# Patient Record
Sex: Male | Born: 1965 | Race: Black or African American | Hispanic: No | Marital: Married | Smoking: Never smoker
Health system: Southern US, Community
[De-identification: ages and names within clinical notes are randomized; demographics above are authoritative.]

## PROBLEM LIST (undated history)

## (undated) DIAGNOSIS — I10 Essential (primary) hypertension: Secondary | ICD-10-CM

## (undated) DIAGNOSIS — M1990 Unspecified osteoarthritis, unspecified site: Secondary | ICD-10-CM

## (undated) DIAGNOSIS — C61 Malignant neoplasm of prostate: Secondary | ICD-10-CM

## (undated) DIAGNOSIS — K219 Gastro-esophageal reflux disease without esophagitis: Secondary | ICD-10-CM

## (undated) DIAGNOSIS — G4730 Sleep apnea, unspecified: Secondary | ICD-10-CM

## (undated) DIAGNOSIS — E119 Type 2 diabetes mellitus without complications: Secondary | ICD-10-CM

## (undated) DIAGNOSIS — I2510 Atherosclerotic heart disease of native coronary artery without angina pectoris: Secondary | ICD-10-CM

## (undated) DIAGNOSIS — F419 Anxiety disorder, unspecified: Secondary | ICD-10-CM

## (undated) DIAGNOSIS — M109 Gout, unspecified: Secondary | ICD-10-CM

## (undated) HISTORY — PX: PROSTATE BIOPSY: SHX241

## (undated) HISTORY — PX: WISDOM TOOTH EXTRACTION: SHX21

---

## 2018-03-17 ENCOUNTER — Emergency Department (HOSPITAL_COMMUNITY)
Admission: EM | Admit: 2018-03-17 | Discharge: 2018-03-17 | Disposition: A | Discharge status: Home / Self Care | Payer: Self-pay | Attending: Emergency Medicine | Admitting: Emergency Medicine

## 2018-03-17 ENCOUNTER — Other Ambulatory Visit: Payer: Self-pay

## 2018-03-17 ENCOUNTER — Encounter (HOSPITAL_COMMUNITY): Payer: Self-pay | Admitting: Emergency Medicine

## 2018-03-17 DIAGNOSIS — R5383 Other fatigue: Secondary | ICD-10-CM | POA: Insufficient documentation

## 2018-03-17 DIAGNOSIS — I10 Essential (primary) hypertension: Secondary | ICD-10-CM | POA: Insufficient documentation

## 2018-03-17 HISTORY — DX: Essential (primary) hypertension: I10

## 2018-03-17 LAB — CBC WITH DIFFERENTIAL/PLATELET
Basophils Absolute: 0 10*3/uL (ref 0.0–0.1)
Basophils Relative: 0 %
Eosinophils Absolute: 0.3 10*3/uL (ref 0.0–0.5)
Eosinophils Relative: 6 %
HCT: 48 % (ref 39.0–52.0)
Hemoglobin: 14.4 g/dL (ref 13.0–17.0)
Lymphocytes Relative: 44 %
Lymphs Abs: 2.3 10*3/uL (ref 0.7–4.0)
MCH: 21.6 pg — ABNORMAL LOW (ref 26.0–34.0)
MCHC: 30 g/dL (ref 30.0–36.0)
MCV: 72.1 fL — ABNORMAL LOW (ref 80.0–100.0)
Monocytes Absolute: 0.3 10*3/uL (ref 0.1–1.0)
Monocytes Relative: 6 %
Neutro Abs: 2.3 10*3/uL (ref 1.7–7.7)
Neutrophils Relative %: 44 %
Platelets: 219 10*3/uL (ref 150–400)
RBC: 6.66 MIL/uL — ABNORMAL HIGH (ref 4.22–5.81)
RDW: 16 % — ABNORMAL HIGH (ref 11.5–15.5)
WBC: 5.3 10*3/uL (ref 4.0–10.5)
nRBC: 0 % (ref 0.0–0.2)
nRBC: 0 /100 WBC

## 2018-03-17 LAB — COMPREHENSIVE METABOLIC PANEL
ALT: 28 U/L (ref 0–44)
AST: 29 U/L (ref 15–41)
Albumin: 4 g/dL (ref 3.5–5.0)
Alkaline Phosphatase: 39 U/L (ref 38–126)
Anion gap: 8 (ref 5–15)
BUN: 12 mg/dL (ref 6–20)
CO2: 26 mmol/L (ref 22–32)
Calcium: 9.4 mg/dL (ref 8.9–10.3)
Chloride: 103 mmol/L (ref 98–111)
Creatinine, Ser: 0.98 mg/dL (ref 0.61–1.24)
GFR calc Af Amer: >60 mL/min (ref >60)
GFR calc non Af Amer: >60 mL/min (ref >60)
Glucose, Bld: 94 mg/dL (ref 70–99)
Potassium: 3.6 mmol/L (ref 3.5–5.1)
Sodium: 137 mmol/L (ref 135–145)
Total Bilirubin: 0.7 mg/dL (ref 0.3–1.2)
Total Protein: 7.4 g/dL (ref 6.5–8.1)

## 2018-03-17 LAB — LIPASE, BLOOD: Lipase: 27 U/L (ref 11–51)

## 2018-03-17 LAB — I-STAT TROPONIN, ED: Troponin i, poc: 0 ng/mL (ref 0.00–0.08)

## 2018-03-17 MED ORDER — ONDANSETRON HCL 4 MG PO TABS: 4.0000 mg | ORAL_TABLET | Freq: Four times a day (QID) | ORAL | 0 refills | Status: DC

## 2018-03-17 MED ORDER — SODIUM CHLORIDE 0.9 % IV BOLUS
1000.0000 mL | Freq: Once | INTRAVENOUS | Status: AC
  Administered 2018-03-17: 1000 mL via INTRAVENOUS

## 2018-03-17 MED ORDER — ONDANSETRON HCL 4 MG/2ML IJ SOLN
4.0000 mg | Freq: Once | INTRAMUSCULAR | Status: AC
  Administered 2018-03-17: 4 mg via INTRAVENOUS
  Filled 2018-03-17: qty 2

## 2018-03-17 NOTE — ED Triage Notes (Signed)
Pt from home with complaints of dizziness and lightheaded. Pt complains of being hot and weak since this morning. Pt states he stopped taking his BP meds 6 year ago.

## 2018-03-17 NOTE — ED Notes (Signed)
Main lab called about pt labs, they are processing them now

## 2018-03-17 NOTE — ED Notes (Signed)
Patient verbalizes understanding of discharge instructions. Opportunity for questioning and answers were provided. Armband removed by staff, pt discharged from ED ambulatory to home.  

## 2018-03-17 NOTE — Discharge Instructions (Addendum)
Please read attached information. If you experience any new or worsening signs or symptoms please return to the emergency room for evaluation. Please follow-up with your primary care provider or specialist as discussed. Please use medication prescribed only as directed and discontinue taking if you have any concerning signs or symptoms.   °

## 2018-03-17 NOTE — ED Provider Notes (Addendum)
[name] EMERGENCY DEPARTMENT Provider Note   CSN: [id number] Arrival date & time: 03/17/18  1337     History   Chief Complaint Chief Complaint  Patient presents with  . Dizziness    HPI Brandon Henson is a 52 y.o. male.  HPI   52 year old male presents today with complaints of weakness.  Patient notes symptoms started yesterday with generalized weakness.  He notes symptoms worsened today with the addition of nausea.  He denies any fever chills, denies any chest pain shortness of breath, cough, urinary symptoms, diarrhea or any other infectious symptoms.  Patient notes that he does not smoke, drink alcohol, or use drugs.  Patient has a history of high cholesterol and hypertension for which he is not taking medications.  Denies any personal or family cardiac history.  Patient denies any close sick contacts.  He notes nausea, no vomiting prior to evaluation.    Past Medical History:  Diagnosis Date  . Hypertension     There are no active problems to display for this patient.   History reviewed. No pertinent surgical history.      Home Medications    Prior to Admission medications   Medication Sig Start Date End Date Taking? Authorizing Provider  ondansetron (ZOFRAN) 4 MG tablet Take 1 tablet (4 mg total) by mouth every 6 (six) hours. 03/17/18   Eyvonne Mechanic, PA-C    Family History No family history on file.  Social History Social History   Tobacco Use  . Smoking status: Not on file  Substance Use Topics  . Alcohol use: Never    Frequency: Never  . Drug use: Never     Allergies   Patient has no allergy information on record.   Review of Systems Review of Systems  All other systems reviewed and are negative.    Physical Exam Updated Vital Signs BP (!) 150/90 (BP Location: Right Arm)   Pulse 84   Temp 98.5 F (36.9 C) (Axillary)   Resp 20   Ht 6' (1.829 m)   Wt (!) 165.6 kg   SpO2 98%   BMI 49.50 kg/m   Physical Exam   Constitutional: He is oriented to person, place, and time. He appears well-developed and well-nourished.  HENT:  Head: Normocephalic and atraumatic.  Eyes: Pupils are equal, round, and reactive to light. Conjunctivae are normal. Right eye exhibits no discharge. Left eye exhibits no discharge. No scleral icterus.  Neck: Normal range of motion. No JVD present. No tracheal deviation present.  Cardiovascular: Normal rate, regular rhythm, normal heart sounds and intact distal pulses. Exam reveals no friction rub.  No murmur heard. Pulmonary/Chest: Effort normal and breath sounds normal. No stridor. No respiratory distress. He has no wheezes. He has no rales. He exhibits no tenderness.  Abdominal: Soft. He exhibits no distension and no mass. There is no tenderness. There is no rebound and no guarding. No hernia.  Neurological: He is alert and oriented to person, place, and time. Coordination normal.  Psychiatric: He has a normal mood and affect. His behavior is normal. Judgment and thought content normal.  Nursing note and vitals reviewed.    ED Treatments / Results  Labs (all labs ordered are listed, but only abnormal results are displayed) Labs Reviewed  CBC WITH DIFFERENTIAL/PLATELET - Abnormal; Notable for the following components:      Result Value   RBC 6.66 (*)    MCV 72.1 (*)    MCH 21.6 (*)    RDW  16.0 (*)    All other components within normal limits  COMPREHENSIVE METABOLIC PANEL  LIPASE, BLOOD  I-STAT TROPONIN, ED    EKG None  Radiology No results found.  Procedures Procedures (including critical care time)  Medications Ordered in ED Medications  sodium chloride 0.9 % bolus 1,000 mL (0 mLs Intravenous Stopped 03/17/18 1607)  ondansetron (ZOFRAN) injection 4 mg (4 mg Intravenous Given 03/17/18 1419)     Initial Impression / Assessment and Plan / ED Course  I have reviewed the triage vital signs and the nursing notes.  Pertinent labs & imaging results that were  available during my care of the patient were reviewed by me and considered in my medical decision making (see chart for details).     52 year old male presents today with complaints of fatigue.  He has very reassuring work-up here, has no complaints of pain, he was given a liter of fluid, ambulated with no dizziness or weakness.  Labs reassuring, discharged with strict return precautions.  He verbalized understanding and agreement to today's plan had no further questions or concerns.  Final Clinical Impressions(s) / ED Diagnoses   Final diagnoses:  Fatigue, unspecified type    ED Discharge Orders         Ordered    ondansetron (ZOFRAN) 4 MG tablet  Every 6 hours     03/17/18 1538           Eyvonne Mechanic, PA-C 03/17/18 1541    Alphus Zeck, Tinnie Gens, PA-C 03/17/18 1616    Mancel Bale, MD 03/18/18 1358

## 2018-05-27 ENCOUNTER — Encounter (HOSPITAL_COMMUNITY): Payer: Self-pay | Admitting: Emergency Medicine

## 2018-05-27 ENCOUNTER — Emergency Department (HOSPITAL_COMMUNITY)
Admission: EM | Admit: 2018-05-27 | Discharge: 2018-05-27 | Disposition: A | Discharge status: Home / Self Care | Payer: Self-pay | Attending: Emergency Medicine | Admitting: Emergency Medicine

## 2018-05-27 DIAGNOSIS — I10 Essential (primary) hypertension: Secondary | ICD-10-CM | POA: Insufficient documentation

## 2018-05-27 DIAGNOSIS — G8929 Other chronic pain: Secondary | ICD-10-CM

## 2018-05-27 DIAGNOSIS — M25562 Pain in left knee: Secondary | ICD-10-CM | POA: Insufficient documentation

## 2018-05-27 DIAGNOSIS — M25561 Pain in right knee: Secondary | ICD-10-CM | POA: Insufficient documentation

## 2018-05-27 MED ORDER — IBUPROFEN 600 MG PO TABS: 600.0000 mg | ORAL_TABLET | Freq: Four times a day (QID) | ORAL | 0 refills | Status: DC | PRN

## 2018-05-27 MED ORDER — CYCLOBENZAPRINE HCL 10 MG PO TABS: 10.0000 mg | ORAL_TABLET | Freq: Two times a day (BID) | ORAL | 0 refills | Status: DC | PRN

## 2018-05-27 NOTE — ED Notes (Signed)
Patient verbalizes understanding of discharge instructions. Opportunity for questioning and answers were provided. Armband removed by staff, pt discharged from ED.  

## 2018-05-27 NOTE — ED Provider Notes (Signed)
[name] Provider Note   CSN: [id number] Arrival date & time: 05/27/18  1021     History   Chief Complaint Chief Complaint  Patient presents with  . Leg Pain    HPI Brandon Henson is a 53 y.o. male.  The history is provided by the patient. No language interpreter was used.  Leg Pain  Associated symptoms: no fever      53 year old obese male presenting for evaluation of leg pain.  Patient report for more than a month he has had pain involving his right knee and now his left knee.  He described pain as a sharp achy sensation, nonradiating, waxing waning, worsening with prolonged standing.  Increasing pain last night especially to the back of his left knee.  He denies any specific injury but states he played football in his youth and may have injured his knee.  He denies any pain in his hip or ankle.  No fever chills or rash.  He takes Motrin on occasion for his pain.  He is here at the recommendation of his wife for evaluation.  No prior history of PE or DVT.  Patient does have history of high blood pressure but currently not on any blood pressure medication.  Past Medical History:  Diagnosis Date  . Hypertension     There are no active problems to display for this patient.   History reviewed. No pertinent surgical history.      Home Medications    Prior to Admission medications   Medication Sig Start Date End Date Taking? Authorizing Provider  ondansetron (ZOFRAN) 4 MG tablet Take 1 tablet (4 mg total) by mouth every 6 (six) hours. 03/17/18   Eyvonne MechanicHedges, Jeffrey, PA-C    Family History No family history on file.  Social History Social History   Tobacco Use  . Smoking status: Never Smoker  . Smokeless tobacco: Never Used  Substance Use Topics  . Alcohol use: Never    Frequency: Never  . Drug use: Never     Allergies   Patient has no known allergies.   Review of Systems Review of Systems  Constitutional: Negative  for fever.  Musculoskeletal: Positive for arthralgias.  Skin: Negative for wound.  Neurological: Negative for numbness.     Physical Exam Updated Vital Signs BP (!) 168/90 (BP Location: Right Arm)   Pulse 78   Temp 98 F (36.7 C) (Oral)   Resp 16   SpO2 100%   Physical Exam Vitals signs and nursing note reviewed.  Constitutional:      General: He is not in acute distress.    Appearance: He is well-developed. He is obese.  HENT:     Head: Atraumatic.  Eyes:     Conjunctiva/sclera: Conjunctivae normal.  Neck:     Musculoskeletal: Neck supple.  Musculoskeletal:        General: Tenderness (Right knee: No significant tenderness to palpation about the knee.  Normal knee flexion and extension, no effusion noted.  Left knee: Tenderness to posterior knee with increasing pain from knee extension.  No joint laxity, patella is located,) present.     Comments: Hips and ankles are nontender on exam.  Skin:    Findings: No rash.  Neurological:     Mental Status: He is alert.      ED Treatments / Results  Labs (all labs ordered are listed, but only abnormal results are displayed) Labs Reviewed - No data to display  EKG None  Radiology  No results found.  Procedures Procedures (including critical care time)  Medications Ordered in ED Medications - No data to display   Initial Impression / Assessment and Plan / ED Course  I have reviewed the triage vital signs and the nursing notes.  Pertinent labs & imaging results that were available during my care of the patient were reviewed by me and considered in my medical decision making (see chart for details).     BP (!) 168/90 (BP Location: Right Arm)   Pulse 78   Temp 98 F (36.7 C) (Oral)   Resp 16   SpO2 100%    Final Clinical Impressions(s) / ED Diagnoses   Final diagnoses:  Bilateral chronic knee pain    ED Discharge Orders         Ordered    ibuprofen (ADVIL,MOTRIN) 600 MG tablet  Every 6 hours PRN      05/27/18 1105    cyclobenzaprine (FLEXERIL) 10 MG tablet  2 times daily PRN     05/27/18 1105         11:02 AM Chronic bilateral knee pain, increasing pain to the left posterior region.  Suspect Baker's cyst causing his pain.  He does not have any calf tenderness to suggest DVT.  No evidence of overlying skin changes concerning for infectious etiology such as cellulitis.  Low suspicion for septic joint.  No recent injury to suggest fracture or dislocation.  He is able to ambulate with a limp.  Recommend rice therapy, outpatient follow-up with Ortho as needed.  Also encouraged weight reduction as patient is obese.  Patient voiced understanding and agrees with plan.  X-ray is not indicated at this time.   Fayrene Helper, PA-C 05/27/18 1106    Derwood Kaplan, MD 05/28/18 1134

## 2018-05-27 NOTE — ED Triage Notes (Signed)
Patient to ED c/o L leg pain, behind knee. Reports old injury and it acts up from time to time, pain has been increasing over the last month, worse with movement. Ambulatory with steady gait.

## 2018-08-10 ENCOUNTER — Other Ambulatory Visit: Payer: Self-pay

## 2018-08-10 ENCOUNTER — Emergency Department (HOSPITAL_COMMUNITY)
Admission: EM | Admit: 2018-08-10 | Discharge: 2018-08-10 | Disposition: A | Discharge status: Home / Self Care | Payer: Self-pay | Attending: Emergency Medicine | Admitting: Emergency Medicine

## 2018-08-10 ENCOUNTER — Encounter (HOSPITAL_COMMUNITY): Payer: Self-pay | Admitting: Emergency Medicine

## 2018-08-10 DIAGNOSIS — I10 Essential (primary) hypertension: Secondary | ICD-10-CM | POA: Insufficient documentation

## 2018-08-10 DIAGNOSIS — R05 Cough: Secondary | ICD-10-CM | POA: Insufficient documentation

## 2018-08-10 DIAGNOSIS — R059 Cough, unspecified: Secondary | ICD-10-CM

## 2018-08-10 MED ORDER — BENZONATATE 100 MG PO CAPS: 100.0000 mg | ORAL_CAPSULE | Freq: Three times a day (TID) | ORAL | 0 refills | Status: DC

## 2018-08-10 NOTE — Discharge Instructions (Addendum)
Please read attached information. If you experience any new or worsening signs or symptoms please return to the emergency room for evaluation. Please follow-up with your primary care provider or specialist as discussed. Please use medication prescribed only as directed and discontinue taking if you have any concerning signs or symptoms.   °

## 2018-08-10 NOTE — ED Triage Notes (Signed)
Pt. Stated, Brandon Henson had a cough for a couple of days but got worse this morning. DEnies any other symptoms

## 2018-08-10 NOTE — ED Provider Notes (Addendum)
[name] EMERGENCY DEPARTMENT Provider Note   CSN: [id number] Arrival date & time: 08/10/18  3790    History   Chief Complaint Chief Complaint  Patient presents with  . Cough    HPI Brandon Henson is a 53 y.o. male.     HPI   53 year old male presents today with complaints of 3 days of cough.  Patient notes is nonproductive, no associated shortness of breath, denies any fever.  He denies any rhinorrhea or nasal congestion.  He denies any close sick contacts or recent travel.  He denies any indigestion or acid reflux.  He has not tried any medications for this.  He notes symptoms were worse this morning upon awakening.  He notes he works in [employer].  He reports a history of hypertension but does not take medication for this and cannot recall what medication he is supposed to be taking for this as he has not taken in several years.  He is a non-smoker.  Past Medical History:  Diagnosis Date  . Hypertension     There are no active problems to display for this patient.   History reviewed. No pertinent surgical history.      Home Medications    Prior to Admission medications   Medication Sig Start Date End Date Taking? Authorizing Provider  benzonatate (TESSALON) 100 MG capsule Take 1 capsule (100 mg total) by mouth every 8 (eight) hours. 08/10/18   Kira Hartl, Tinnie Gens, PA-C  cyclobenzaprine (FLEXERIL) 10 MG tablet Take 1 tablet (10 mg total) by mouth 2 (two) times daily as needed for muscle spasms. 05/27/18   Fayrene Helper, PA-C  ibuprofen (ADVIL,MOTRIN) 600 MG tablet Take 1 tablet (600 mg total) by mouth every 6 (six) hours as needed. 05/27/18   Fayrene Helper, PA-C  ondansetron (ZOFRAN) 4 MG tablet Take 1 tablet (4 mg total) by mouth every 6 (six) hours. 03/17/18   Eyvonne Mechanic, PA-C    Family History No family history on file.  Social History Social History   Tobacco Use  . Smoking status: Never Smoker  . Smokeless tobacco: Never Used   Substance Use Topics  . Alcohol use: Never    Frequency: Never  . Drug use: Never     Allergies   Patient has no known allergies.   Review of Systems Review of Systems  All other systems reviewed and are negative.   Physical Exam Updated Vital Signs BP (!) 169/113 (BP Location: Left Arm)   Pulse 79   Temp 98.4 F (36.9 C) (Oral)   Resp 17   SpO2 100%   Physical Exam Vitals signs and nursing note reviewed.  Constitutional:      Appearance: He is well-developed.  HENT:     Head: Normocephalic and atraumatic.     Comments: Oropharynx clear no erythema, exudate or swelling-no rhinorrhea, no mucosal edema Eyes:     General: No scleral icterus.       Right eye: No discharge.        Left eye: No discharge.     Conjunctiva/sclera: Conjunctivae normal.     Pupils: Pupils are equal, round, and reactive to light.  Neck:     Musculoskeletal: Normal range of motion.     Vascular: No JVD.     Trachea: No tracheal deviation.  Pulmonary:     Effort: Pulmonary effort is normal. No respiratory distress.     Breath sounds: Normal breath sounds. No stridor. No wheezing, rhonchi or rales.  Comments: No cough noted Neurological:     Mental Status: He is alert and oriented to person, place, and time.     Coordination: Coordination normal.  Psychiatric:        Behavior: Behavior normal.        Thought Content: Thought content normal.        Judgment: Judgment normal.      ED Treatments / Results  Labs (all labs ordered are listed, but only abnormal results are displayed) Labs Reviewed - No data to display  EKG None  Radiology No results found.  Procedures Procedures (including critical care time)  Medications Ordered in ED Medications - No data to display   Initial Impression / Assessment and Plan / ED Course  I have reviewed the triage vital signs and the nursing notes.  Pertinent labs & imaging results that were available during my care of the patient were  reviewed by me and considered in my medical decision making (see chart for details).        53 year old male presents today with cough.  He has no other acute complaints here today.  Question early viral infection.  Low suspicion for coronavirus low suspicion for influenza no signs of bacterial etiology.  Patient will be treated with Claritin, Tessalon, and encouraged to follow-up if symptoms persist or return if they worsen.  Patient encouraged follow-up with primary care provider for evaluation and treatment of his hypertension.  He is asymptomatic at this time.  He verbalized understanding and agreement to today's plan.  Brandon Henson was evaluated in Emergency Department on 08/10/2018 for the symptoms described in the history of present illness. He was evaluated in the context of the global COVID-19 pandemic, which necessitated consideration that the patient might be at risk for infection with the SARS-CoV-2 virus that causes COVID-19. Institutional protocols and algorithms that pertain to the evaluation of patients at risk for COVID-19 are in a state of rapid change based on information released by regulatory bodies including the CDC and federal and state organizations. These policies and algorithms were followed during the patient's care in the ED. Final Clinical Impressions(s) / ED Diagnoses   Final diagnoses:  Cough  Hypertension, unspecified type    ED Discharge Orders         Ordered    benzonatate (TESSALON) 100 MG capsule  Every 8 hours     08/10/18 0907           Eyvonne Mechanic, PA-C 08/10/18 0910    Katria Botts, Tinnie Gens, PA-C 08/10/18 3383    Gwyneth Sprout, MD 08/10/18 [phone]

## 2019-02-15 ENCOUNTER — Other Ambulatory Visit: Payer: Self-pay

## 2019-02-15 ENCOUNTER — Encounter (HOSPITAL_COMMUNITY): Payer: Self-pay

## 2019-02-15 ENCOUNTER — Emergency Department (HOSPITAL_COMMUNITY)
Admission: EM | Admit: 2019-02-15 | Discharge: 2019-02-15 | Disposition: A | Discharge status: Home / Self Care | Payer: Self-pay | Attending: Emergency Medicine | Admitting: Emergency Medicine

## 2019-02-15 DIAGNOSIS — W2209XA Striking against other stationary object, initial encounter: Secondary | ICD-10-CM | POA: Insufficient documentation

## 2019-02-15 DIAGNOSIS — I10 Essential (primary) hypertension: Secondary | ICD-10-CM | POA: Insufficient documentation

## 2019-02-15 DIAGNOSIS — F0781 Postconcussional syndrome: Secondary | ICD-10-CM

## 2019-02-15 DIAGNOSIS — R519 Headache, unspecified: Secondary | ICD-10-CM | POA: Insufficient documentation

## 2019-02-15 MED ORDER — KETOROLAC TROMETHAMINE 30 MG/ML IJ SOLN
30.0000 mg | Freq: Once | INTRAMUSCULAR | Status: AC
  Administered 2019-02-15: 13:00:00 30 mg via INTRAMUSCULAR
  Filled 2019-02-15: qty 1

## 2019-02-15 NOTE — ED Provider Notes (Signed)
[name] EMERGENCY DEPARTMENT Provider Note   CSN: [id number] Arrival date & time: 02/15/19  0907     History   Chief Complaint No chief complaint on file.   HPI Brandon Henson is a 53 y.o. male.     HPI    53 year old male presents today with complaints of headache.  Patient notes that 2 days ago he struck his head on a moving van while lifting boxes.  He notes he saw stars and felt dizzy but denied any loss of consciousness.  He notes a pain over the area presently, denies any neurological deficits or neck pain. Tylenol and nsaids used prior to arrival.   Past Medical History:  Diagnosis Date  . Hypertension     There are no active problems to display for this patient.   History reviewed. No pertinent surgical history.      Home Medications    Prior to Admission medications   Medication Sig Start Date End Date Taking? Authorizing Provider  benzonatate (TESSALON) 100 MG capsule Take 1 capsule (100 mg total) by mouth every 8 (eight) hours. 08/10/18   Bryndan Bilyk, Tinnie Gens, PA-C  cyclobenzaprine (FLEXERIL) 10 MG tablet Take 1 tablet (10 mg total) by mouth 2 (two) times daily as needed for muscle spasms. 05/27/18   Fayrene Helper, PA-C  ibuprofen (ADVIL,MOTRIN) 600 MG tablet Take 1 tablet (600 mg total) by mouth every 6 (six) hours as needed. 05/27/18   Fayrene Helper, PA-C  ondansetron (ZOFRAN) 4 MG tablet Take 1 tablet (4 mg total) by mouth every 6 (six) hours. 03/17/18   Eyvonne Mechanic, PA-C    Family History No family history on file.  Social History Social History   Tobacco Use  . Smoking status: Never Smoker  . Smokeless tobacco: Never Used  Substance Use Topics  . Alcohol use: Never    Frequency: Never  . Drug use: Never     Allergies   Patient has no known allergies.   Review of Systems Review of Systems  All other systems reviewed and are negative.  Physical Exam Updated Vital Signs BP (!) 143/87 (BP Location: Left Arm)   Pulse 69    Temp 98.4 F (36.9 C) (Oral)   Resp 18   SpO2 98%   Physical Exam Vitals signs and nursing note reviewed.  Constitutional:      Appearance: He is well-developed.  HENT:     Head: Normocephalic and atraumatic.     Comments: Head is atraumatic with minimal tenderness to palpation over the left temporal region  Eyes:     General: No scleral icterus.       Right eye: No discharge.        Left eye: No discharge.     Conjunctiva/sclera: Conjunctivae normal.     Pupils: Pupils are equal, round, and reactive to light.  Neck:     Musculoskeletal: Normal range of motion.     Vascular: No JVD.     Trachea: No tracheal deviation.  Pulmonary:     Effort: Pulmonary effort is normal.     Breath sounds: No stridor.  Neurological:     Mental Status: He is alert and oriented to person, place, and time.     Coordination: Coordination normal.  Psychiatric:        Behavior: Behavior normal.        Thought Content: Thought content normal.        Judgment: Judgment normal.      ED Treatments /  Results  Labs (all labs ordered are listed, but only abnormal results are displayed) Labs Reviewed - No data to display  EKG None  Radiology No results found.  Procedures Procedures (including critical care time)  Medications Ordered in ED Medications  ketorolac (TORADOL) 30 MG/ML injection 30 mg (30 mg Intramuscular Given 02/15/19 1328)     Initial Impression / Assessment and Plan / ED Course  I have reviewed the triage vital signs and the nursing notes.  Pertinent labs & imaging results that were available during my care of the patient were reviewed by me and considered in my medical decision making (see chart for details).        Labs:   Imaging:  Consults:  Therapeutics:  Discharge Meds:   Assessment/Plan: 73 YOM with post concussive syndrome, no acute life threatening etiology.  Patient stable for outpatient management return precautions given.  Verbalized understanding  and agreement to today's plan.      Final Clinical Impressions(s) / ED Diagnoses   Final diagnoses:  Postconcussive syndrome    ED Discharge Orders    None       Okey Regal, PA-C 02/15/19 1607    Gareth Morgan, MD 02/15/19 2133

## 2019-02-15 NOTE — ED Triage Notes (Signed)
Patient states that he hit his head on van door Monday and having some ongoing dizziness with movement, no loc. ambulatory

## 2019-02-15 NOTE — Discharge Instructions (Signed)
Please read attached information. If you experience any new or worsening signs or symptoms please return to the emergency room for evaluation. Please follow-up with your primary care provider or specialist as discussed.  °

## 2019-05-01 ENCOUNTER — Emergency Department (HOSPITAL_COMMUNITY)
Admission: EM | Admit: 2019-05-01 | Discharge: 2019-05-01 | Disposition: A | Discharge status: Home / Self Care | Payer: Self-pay | Attending: Emergency Medicine | Admitting: Emergency Medicine

## 2019-05-01 ENCOUNTER — Encounter (HOSPITAL_COMMUNITY): Payer: Self-pay | Admitting: Emergency Medicine

## 2019-05-01 DIAGNOSIS — Z20828 Contact with and (suspected) exposure to other viral communicable diseases: Secondary | ICD-10-CM | POA: Insufficient documentation

## 2019-05-01 DIAGNOSIS — R05 Cough: Secondary | ICD-10-CM | POA: Insufficient documentation

## 2019-05-01 DIAGNOSIS — I10 Essential (primary) hypertension: Secondary | ICD-10-CM | POA: Insufficient documentation

## 2019-05-01 DIAGNOSIS — Z20822 Contact with and (suspected) exposure to covid-19: Secondary | ICD-10-CM

## 2019-05-01 DIAGNOSIS — J3489 Other specified disorders of nose and nasal sinuses: Secondary | ICD-10-CM | POA: Insufficient documentation

## 2019-05-01 DIAGNOSIS — R079 Chest pain, unspecified: Secondary | ICD-10-CM | POA: Insufficient documentation

## 2019-05-01 LAB — POC SARS CORONAVIRUS 2 AG -  ED: SARS Coronavirus 2 Ag: NEGATIVE

## 2019-05-01 MED ORDER — BENZONATATE 100 MG PO CAPS: 100.0000 mg | ORAL_CAPSULE | Freq: Three times a day (TID) | ORAL | 0 refills | Status: AC

## 2019-05-01 MED ORDER — FLUTICASONE PROPIONATE 50 MCG/ACT NA SUSP: 2.0000 | Freq: Every day | NASAL | 0 refills | Status: DC

## 2019-05-01 NOTE — ED Notes (Signed)
Patient verbalizes understanding of discharge instructions. Opportunity for questioning and answers were provided. Armband removed by staff, pt discharged from ED.  

## 2019-05-01 NOTE — ED Triage Notes (Signed)
Pt presents with concerns of being exposed to Covid via another employee on Thurs. Reports only nasal congestion and cough at this time. A/O x4.   Standing order for POC swab placed.

## 2019-05-01 NOTE — ED Provider Notes (Signed)
[name] EMERGENCY DEPARTMENT Provider Note   CSN: [id number] Arrival date & time: 05/01/19  8588     History Chief Complaint  Patient presents with  . Covid exposure    Brandon Henson is a 53 y.o. male.  HPI   Pt is a 53 y/o male with a h/o HTN who presents to the ED today requesting evaluation for COVID. Pt states he was exposed to someone with COVID at work on 12/17. He was around this person for 4 hours working directly with her. He reports rhinorrhea and mild chest congestion. He has a mild cough. Denies chest pain, sob, fevers. Denies NVD, abd pain, sore throat.   Past Medical History:  Diagnosis Date  . Hypertension     There are no problems to display for this patient.   History reviewed. No pertinent surgical history.     No family history on file.  Social History   Tobacco Use  . Smoking status: Never Smoker  . Smokeless tobacco: Never Used  Substance Use Topics  . Alcohol use: Never  . Drug use: Never    Home Medications Prior to Admission medications   Medication Sig Start Date End Date Taking? Authorizing Provider  benzonatate (TESSALON) 100 MG capsule Take 1 capsule (100 mg total) by mouth every 8 (eight) hours for 5 days. 05/01/19 05/06/19  Jonn Chaikin S, PA-C  cyclobenzaprine (FLEXERIL) 10 MG tablet Take 1 tablet (10 mg total) by mouth 2 (two) times daily as needed for muscle spasms. 05/27/18   Fayrene Helper, PA-C  fluticasone (FLONASE) 50 MCG/ACT nasal spray Place 2 sprays into both nostrils daily. 05/01/19   Erleen Egner S, PA-C  ibuprofen (ADVIL,MOTRIN) 600 MG tablet Take 1 tablet (600 mg total) by mouth every 6 (six) hours as needed. 05/27/18   Fayrene Helper, PA-C  ondansetron (ZOFRAN) 4 MG tablet Take 1 tablet (4 mg total) by mouth every 6 (six) hours. 03/17/18   Eyvonne Mechanic, PA-C    Allergies    Patient has no known allergies.  Review of Systems   Review of Systems  Constitutional: Negative for fever.  HENT:  Positive for rhinorrhea. Negative for congestion, ear pain and sore throat.   Eyes: Negative for visual disturbance.  Respiratory: Positive for cough. Negative for shortness of breath.   Cardiovascular: Negative for chest pain.  Gastrointestinal: Negative for abdominal pain, constipation, diarrhea, nausea and vomiting.  Genitourinary: Negative for dysuria and hematuria.  Musculoskeletal: Negative for back pain.  Skin: Negative for rash.  Neurological: Negative for headaches.  All other systems reviewed and are negative.   Physical Exam Updated Vital Signs BP (!) 151/98 (BP Location: Left Arm)   Pulse 84   Temp 98.2 F (36.8 C) (Oral)   Resp 14   SpO2 100%   Physical Exam Vitals and nursing note reviewed.  Constitutional:      Appearance: He is well-developed. He is not ill-appearing.  HENT:     Head: Normocephalic and atraumatic.  Eyes:     Conjunctiva/sclera: Conjunctivae normal.  Cardiovascular:     Rate and Rhythm: Normal rate and regular rhythm.     Heart sounds: Normal heart sounds. No murmur.  Pulmonary:     Effort: Pulmonary effort is normal. No respiratory distress.     Breath sounds: Normal breath sounds. No wheezing, rhonchi or rales.  Abdominal:     General: Bowel sounds are normal.     Palpations: Abdomen is soft.     Tenderness: There is  no abdominal tenderness.  Musculoskeletal:     Cervical back: Neck supple.  Skin:    General: Skin is warm and dry.  Neurological:     Mental Status: He is alert.     ED Results / Procedures / Treatments   Labs (all labs ordered are listed, but only abnormal results are displayed) Labs Reviewed  NOVEL CORONAVIRUS, NAA (HOSP ORDER, SEND-OUT TO REF LAB; TAT 18-24 HRS)  POC SARS CORONAVIRUS 2 AG -  ED    EKG None  Radiology No results found.  Procedures Procedures (including critical care time)  Medications Ordered in ED Medications - No data to display  ED Course  I have reviewed the triage vital signs  and the nursing notes.  Pertinent labs & imaging results that were available during my care of the patient were reviewed by me and considered in my medical decision making (see chart for details).    MDM Rules/Calculators/A&P                      53 year old male presenting for evaluation for coronavirus after he had exposure to someone who was positive for Covid a few days ago.  He has had some runny nose and a mild cough but he states that symptoms are minimal.  His point-of-care test for Covid was negative.  Will complete send out testing for Covid.  Advised on quarantine measures in the meantime.  Gave Rx for symptomatic treatment at home.  Advised on follow-up with PCP and gave strict return precautions.  Patient voiced understanding of the plan and reasons for return.  All questions answered, patient stable for discharge.  ---  Brandon Henson was evaluated in Emergency Department on 05/01/2019 for the symptoms described in the history of present illness. He was evaluated in the context of the global COVID-19 pandemic, which necessitated consideration that the patient might be at risk for infection with the SARS-CoV-2 virus that causes COVID-19. Institutional protocols and algorithms that pertain to the evaluation of patients at risk for COVID-19 are in a state of rapid change based on information released by regulatory bodies including the CDC and federal and state organizations. These policies and algorithms were followed during the patient's care in the ED.   Final Clinical Impression(s) / ED Diagnoses Final diagnoses:  Suspected COVID-19 virus infection    Rx / DC Orders ED Discharge Orders         Ordered    fluticasone (FLONASE) 50 MCG/ACT nasal spray  Daily     05/01/19 1109    benzonatate (TESSALON) 100 MG capsule  Every 8 hours     05/01/19 [address] Bruce Churilla S, PA-C 05/01/19 1111    Quintella Reichert, MD 05/02/19 [phone]

## 2019-05-01 NOTE — Discharge Instructions (Addendum)
You were given a prescription for Flonase to help with your nasal congestion and runny nose.  Please use as directed.  If you develop a cough, you were also given a prescription for Tessalon Perles.  You were tested for the coronavirus today.  The results will not be available for the next 2 to 3 days.  If the results are positive the hospital will contact you and let you know.  If the results are negative you will not hear back from the hospital however you can check the status of your test on my chart.  You should be isolated for at least 7 days since the onset of your symptoms AND >72 hours after symptoms resolution (absence of fever without the use of fever reducing medication and improvement in respiratory symptoms), whichever is longer  Please follow up with your primary care provider within 5-7 days for re-evaluation of your symptoms. If you do not have a primary care provider, information for a healthcare clinic has been provided for you to make arrangements for follow up care. Please return to the emergency department for any new or worsening symptoms including shortness of breath or chest pain.

## 2019-05-02 LAB — NOVEL CORONAVIRUS, NAA (HOSP ORDER, SEND-OUT TO REF LAB; TAT 18-24 HRS): SARS-CoV-2, NAA: NOT DETECTED

## 2019-07-11 ENCOUNTER — Emergency Department (HOSPITAL_COMMUNITY): Payer: Self-pay

## 2019-07-11 ENCOUNTER — Encounter (HOSPITAL_COMMUNITY): Payer: Self-pay

## 2019-07-11 ENCOUNTER — Emergency Department (HOSPITAL_COMMUNITY)
Admission: EM | Admit: 2019-07-11 | Discharge: 2019-07-11 | Disposition: A | Discharge status: Home / Self Care | Payer: Self-pay | Attending: Emergency Medicine | Admitting: Emergency Medicine

## 2019-07-11 ENCOUNTER — Other Ambulatory Visit: Payer: Self-pay

## 2019-07-11 DIAGNOSIS — R0789 Other chest pain: Secondary | ICD-10-CM | POA: Insufficient documentation

## 2019-07-11 DIAGNOSIS — I10 Essential (primary) hypertension: Secondary | ICD-10-CM | POA: Insufficient documentation

## 2019-07-11 DIAGNOSIS — I2510 Atherosclerotic heart disease of native coronary artery without angina pectoris: Secondary | ICD-10-CM | POA: Insufficient documentation

## 2019-07-11 HISTORY — DX: Atherosclerotic heart disease of native coronary artery without angina pectoris: I25.10

## 2019-07-11 LAB — CBC WITH DIFFERENTIAL/PLATELET
Abs Immature Granulocytes: 0.02 10*3/uL (ref 0.00–0.07)
Basophils Absolute: 0.1 10*3/uL (ref 0.0–0.1)
Basophils Relative: 1 %
Eosinophils Absolute: 0.3 10*3/uL (ref 0.0–0.5)
Eosinophils Relative: 4 %
HCT: 45 % (ref 39.0–52.0)
Hemoglobin: 13.7 g/dL (ref 13.0–17.0)
Immature Granulocytes: 0 %
Lymphocytes Relative: 27 %
Lymphs Abs: 1.8 10*3/uL (ref 0.7–4.0)
MCH: 22 pg — ABNORMAL LOW (ref 26.0–34.0)
MCHC: 30.4 g/dL (ref 30.0–36.0)
MCV: 72.3 fL — ABNORMAL LOW (ref 80.0–100.0)
Monocytes Absolute: 0.8 10*3/uL (ref 0.1–1.0)
Monocytes Relative: 12 %
Neutro Abs: 3.7 10*3/uL (ref 1.7–7.7)
Neutrophils Relative %: 56 %
Platelets: 242 10*3/uL (ref 150–400)
RBC: 6.22 MIL/uL — ABNORMAL HIGH (ref 4.22–5.81)
RDW: 17.2 % — ABNORMAL HIGH (ref 11.5–15.5)
WBC: 6.7 10*3/uL (ref 4.0–10.5)
nRBC: 0 % (ref 0.0–0.2)

## 2019-07-11 LAB — BASIC METABOLIC PANEL
Anion gap: 10 (ref 5–15)
BUN: 15 mg/dL (ref 6–20)
CO2: 24 mmol/L (ref 22–32)
Calcium: 9 mg/dL (ref 8.9–10.3)
Chloride: 103 mmol/L (ref 98–111)
Creatinine, Ser: 0.98 mg/dL (ref 0.61–1.24)
GFR calc Af Amer: >60 mL/min (ref >60)
GFR calc non Af Amer: >60 mL/min (ref >60)
Glucose, Bld: 132 mg/dL — ABNORMAL HIGH (ref 70–99)
Potassium: 4.2 mmol/L (ref 3.5–5.1)
Sodium: 137 mmol/L (ref 135–145)

## 2019-07-11 LAB — TROPONIN I (HIGH SENSITIVITY)
Troponin I (High Sensitivity): 8 ng/L (ref <18)
Troponin I (High Sensitivity): 9 ng/L (ref <18)

## 2019-07-11 MED ORDER — KETOROLAC TROMETHAMINE 15 MG/ML IJ SOLN
15.0000 mg | Freq: Once | INTRAMUSCULAR | Status: AC
  Administered 2019-07-11: 15 mg via INTRAMUSCULAR
  Filled 2019-07-11: qty 1

## 2019-07-11 NOTE — ED Triage Notes (Signed)
Pt brought in by [facility] with c/o intermittent non-radiating chest tightness x3-4 days that worsened about an hour ago when pt got into an argument with his GF. Denies SOB/N/V. Pt took for 81 mg aspirins prior to EMS arrival and was given to nitros by EMS. Pain decreased from 7 to 5. Pt is A&Ox4, speaking in full sentences. Breathing easy, non-labored. Equal rise and fall of chest. VSS on continuous monitors. Pt has hx of HTN and cardiac stents. Reports he hasn't taken HTN meds in years.

## 2019-07-11 NOTE — Discharge Instructions (Addendum)
If you develop recurrent, continued, or worsening chest pain, shortness of breath, fever, vomiting, abdominal or back pain, or any other new/concerning symptoms then return to the ER for evaluation.  

## 2019-07-11 NOTE — ED Provider Notes (Signed)
[name] EMERGENCY DEPARTMENT Provider Note   CSN: [id number] Arrival date & time: 07/11/19  0518     History Chief Complaint  Patient presents with  . Chest Pain    Brandon Henson is a 54 y.o. male.  HPI     This is a 54 year old male with a history of hypertension and coronary artery disease who presents with 4 to 5-day history of chest tightness.  He describes left-sided chest tightness that is nonradiating.  He states that it has been ongoing and constant over the last several days.  The intensity waxes and wanes.  He states it got worse after an argument with his girlfriend.  Denies any associated cough, fevers, shortness of breath.  Currently his pain is 5 out of 10.  Some improvement with nitroglycerin.  He does report headache.  Denies any exertional symptoms.  Denies any exacerbation with eating.  Denies pleuritic symptoms or lower extremity swelling.  Past Medical History:  Diagnosis Date  . Coronary artery disease   . Hypertension     There are no problems to display for this patient.   History reviewed. No pertinent surgical history.     History reviewed. No pertinent family history.  Social History   Tobacco Use  . Smoking status: Never Smoker  . Smokeless tobacco: Never Used  Substance Use Topics  . Alcohol use: Never  . Drug use: Never    Home Medications Prior to Admission medications   Not on File    Allergies    Patient has no known allergies.  Review of Systems   Review of Systems  Constitutional: Negative for fever.  Respiratory: Negative for cough and shortness of breath.   Cardiovascular: Positive for chest pain. Negative for leg swelling.  Gastrointestinal: Negative for abdominal pain, nausea and vomiting.  Genitourinary: Negative for dysuria.  Neurological: Positive for headaches.  All other systems reviewed and are negative.   Physical Exam Updated Vital Signs BP (!) 142/93   Pulse 65   Temp 98.1 F  (36.7 C) (Oral)   Resp 20   Ht 1.854 m (6\' 1" )   Wt (!) 165.6 kg   SpO2 100%   BMI 48.16 kg/m   Physical Exam Vitals and nursing note reviewed.  Constitutional:      Appearance: He is well-developed. He is obese. He is not ill-appearing.  HENT:     Head: Normocephalic and atraumatic.  Eyes:     Pupils: Pupils are equal, round, and reactive to light.  Cardiovascular:     Rate and Rhythm: Normal rate and regular rhythm.     Heart sounds: Normal heart sounds. No murmur.  Pulmonary:     Effort: Pulmonary effort is normal. No respiratory distress.     Breath sounds: Normal breath sounds. No wheezing.  Chest:     Chest wall: Tenderness present.  Abdominal:     General: Bowel sounds are normal.     Palpations: Abdomen is soft.     Tenderness: There is no abdominal tenderness. There is no rebound.  Musculoskeletal:     Cervical back: Neck supple.     Right lower leg: No tenderness. No edema.     Left lower leg: No tenderness. No edema.  Lymphadenopathy:     Cervical: No cervical adenopathy.  Skin:    General: Skin is warm and dry.  Neurological:     Mental Status: He is alert and oriented to person, place, and time.  Psychiatric:  Mood and Affect: Mood normal.     ED Results / Procedures / Treatments   Labs (all labs ordered are listed, but only abnormal results are displayed) Labs Reviewed  CBC WITH DIFFERENTIAL/PLATELET - Abnormal; Notable for the following components:      Result Value   RBC 6.22 (*)    MCV 72.3 (*)    MCH 22.0 (*)    RDW 17.2 (*)    All other components within normal limits  BASIC METABOLIC PANEL - Abnormal; Notable for the following components:   Glucose, Bld 132 (*)    All other components within normal limits  TROPONIN I (HIGH SENSITIVITY)    EKG EKG Interpretation  Date/Time:  Tuesday July 11 2019 05:22:35 EST Ventricular Rate:  75 PR Interval:    QRS Duration: 97 QT Interval:  396 QTC Calculation: 443 R Axis:   -51 Text  Interpretation: Sinus rhythm Left anterior fascicular block Abnormal R-wave progression, late transition ST elev, probable normal early repol pattern Confirmed by Thayer Jew [phone]) on 07/11/2019 5:25:19 AM   Radiology DG Chest Portable 1 View  Result Date: 07/11/2019 CLINICAL DATA:  Chest pain EXAM: PORTABLE CHEST 1 VIEW COMPARISON:  None. FINDINGS: The heart size and mediastinal contours are within normal limits. Both lungs are clear. The visualized skeletal structures are unremarkable. IMPRESSION: No active disease. Electronically Signed   By: Prudencio Pair M.D.   On: 07/11/2019 06:34    Procedures Procedures (including critical care time)  Medications Ordered in ED Medications  ketorolac (TORADOL) 15 MG/ML injection 15 mg (15 mg Intramuscular Given 07/11/19 2979)    ED Course  I have reviewed the triage vital signs and the nursing notes.  Pertinent labs & imaging results that were available during my care of the patient were reviewed by me and considered in my medical decision making (see chart for details).    MDM Rules/Calculators/A&P                       Patient presents with chest pain.  Certainly has risk factors for coronary artery disease and has had stents placed in the past.  He has tenderness to palpation on exam without crepitus.  Ongoing pain and is quite atypical for ACS.  Blood pressure 142/93.  Otherwise vital signs reassuring.  EKG without ischemic changes.  Initial troponin is negative.  Chest x-ray without pneumothorax or pneumonia.  Will repeat troponin.  Patient was given Toradol for possible musculoskeletal pain.  If repeat troponin is negative he can follow-up with cardiology.  Final Clinical Impression(s) / ED Diagnoses Final diagnoses:  Atypical chest pain    Rx / DC Orders ED Discharge Orders    None       Fariha Goto, Barbette Hair, MD 07/11/19 [phone]

## 2019-07-11 NOTE — ED Notes (Signed)
Labeled labs sent  CXR at bedside

## 2019-07-11 NOTE — ED Notes (Signed)
toradol given per MAR. Name/DOB verified with pt 

## 2019-07-11 NOTE — ED Provider Notes (Signed)
Care transferred to me.  Second troponin is negative.  Patient is feeling better.  He appears stable for outpatient work-up.  He is being referred to a PCP.  Discussed return precautions.   Pricilla Loveless, MD 07/11/19 [phone]

## 2019-07-11 NOTE — ED Notes (Signed)
Care endorsed to Nicole, RN

## 2019-07-11 NOTE — ED Notes (Signed)
Patient verbalizes understanding of discharge instructions. Opportunity for questioning and answers were provided. Armband removed by staff, pt discharged from ED.  

## 2019-07-15 ENCOUNTER — Ambulatory Visit: Payer: Self-pay | Attending: Internal Medicine

## 2019-07-15 DIAGNOSIS — Z23 Encounter for immunization: Secondary | ICD-10-CM | POA: Insufficient documentation

## 2019-07-15 NOTE — Progress Notes (Signed)
   Covid-19 Vaccination Clinic  Name:  Brandon Henson    MRN: [id number] DOB: [birth year]  07/15/2019  [name] was observed post Covid-19 immunization for 15 minutes without incident. He was provided with Vaccine Information Sheet and instruction to access the V-Safe system.   [name] was instructed to call 911 with any severe reactions post vaccine: Marland Kitchen Difficulty breathing  . Swelling of face and throat  . A fast heartbeat  . A bad rash all over body  . Dizziness and weakness   Immunizations Administered    Name Date Dose VIS Date Route   Pfizer COVID-19 Vaccine 07/15/2019 10:38 AM 0.3 mL 04/21/2019 Intramuscular   Manufacturer: [employer], [employer]   Lot: [id number]   NDC: [address]-8

## 2019-08-05 ENCOUNTER — Ambulatory Visit: Payer: Self-pay | Attending: Internal Medicine

## 2019-08-05 DIAGNOSIS — Z23 Encounter for immunization: Secondary | ICD-10-CM

## 2019-08-05 NOTE — Progress Notes (Signed)
   Covid-19 Vaccination Clinic  Name:  Brandon Henson    MRN: [id number] DOB: 07-11-1965  08/05/2019  Mr. Doughtie was observed post Covid-19 immunization for 15 minutes without incident. He was provided with Vaccine Information Sheet and instruction to access the V-Safe system.   Mr. Brockmann was instructed to call 911 with any severe reactions post vaccine: Marland Kitchen Difficulty breathing  . Swelling of face and throat  . A fast heartbeat  . A bad rash all over body  . Dizziness and weakness   Immunizations Administered    Name Date Dose VIS Date Route   Pfizer COVID-19 Vaccine 08/05/2019  9:21 AM 0.3 mL 04/21/2019 Intramuscular   Manufacturer: [employer], [employer]   Lot: [id number]   NDC: [address]-1

## 2020-02-06 ENCOUNTER — Encounter (HOSPITAL_COMMUNITY): Payer: Self-pay | Admitting: *Deleted

## 2020-02-06 ENCOUNTER — Ambulatory Visit (HOSPITAL_COMMUNITY)
Admission: EM | Admit: 2020-02-06 | Discharge: 2020-02-06 | Disposition: A | Discharge status: Home / Self Care | Payer: 59 | Attending: Emergency Medicine | Admitting: Emergency Medicine

## 2020-02-06 ENCOUNTER — Other Ambulatory Visit: Payer: Self-pay

## 2020-02-06 DIAGNOSIS — Z20822 Contact with and (suspected) exposure to covid-19: Secondary | ICD-10-CM | POA: Insufficient documentation

## 2020-02-06 DIAGNOSIS — I10 Essential (primary) hypertension: Secondary | ICD-10-CM | POA: Insufficient documentation

## 2020-02-06 DIAGNOSIS — I2510 Atherosclerotic heart disease of native coronary artery without angina pectoris: Secondary | ICD-10-CM | POA: Insufficient documentation

## 2020-02-06 DIAGNOSIS — B349 Viral infection, unspecified: Secondary | ICD-10-CM

## 2020-02-06 DIAGNOSIS — R6883 Chills (without fever): Secondary | ICD-10-CM | POA: Insufficient documentation

## 2020-02-06 LAB — SARS CORONAVIRUS 2 (TAT 6-24 HRS): SARS Coronavirus 2: NEGATIVE

## 2020-02-06 MED ORDER — DM-GUAIFENESIN ER 30-600 MG PO TB12: 1.0000 | ORAL_TABLET | Freq: Two times a day (BID) | ORAL | 0 refills | Status: DC

## 2020-02-06 MED ORDER — BENZONATATE 200 MG PO CAPS: 200.0000 mg | ORAL_CAPSULE | Freq: Three times a day (TID) | ORAL | 0 refills | Status: AC | PRN

## 2020-02-06 NOTE — Discharge Instructions (Signed)
Covid test pending, monitor my chart for results Tessalon's every 8 hours for cough Mucinex DM for congestion/cough Tylenol and ibuprofen for any body aches, headache, fever Rest and fluids Follow-up if any symptoms not improving or worsening  Follow-up with primary care for recheck of blood pressure [place] [facility]

## 2020-02-06 NOTE — ED Provider Notes (Signed)
[facility]    CSN: [id number] Arrival date & time: 02/06/20  2130      History   Chief Complaint Chief Complaint  Patient presents with  . Chills    HPI Brandon Henson is a 54 y.o. male history of CAD, hypertension presenting today for evaluation of chills.  Reports chills began last week.  Reports diarrhea twice yesterday, has not persisted today.  Has had some slight chest congestion and cough that has been mild.  Denies any abdominal pain nausea or vomiting.  Denies sick contacts.  Currently does not have primary care.  Previously on blood pressure medicine, but has not taken over the past 4 years.  Denies chest pain shortness of breath.  HPI  Past Medical History:  Diagnosis Date  . Coronary artery disease   . Hypertension     There are no problems to display for this patient.   History reviewed. No pertinent surgical history.     Home Medications    Prior to Admission medications   Medication Sig Start Date End Date Taking? Authorizing Provider  benzonatate (TESSALON) 200 MG capsule Take 1 capsule (200 mg total) by mouth 3 (three) times daily as needed for up to 7 days for cough. 02/06/20 02/13/20  Ndidi Nesby C, PA-C  dextromethorphan-guaiFENesin (MUCINEX DM) 30-600 MG 12hr tablet Take 1 tablet by mouth 2 (two) times daily. 02/06/20   Karyn Brull, Junius Creamer, PA-C    Family History Family History  Problem Relation Age of Onset  . Healthy Mother   . Healthy Father     Social History Social History   Tobacco Use  . Smoking status: Never Smoker  . Smokeless tobacco: Never Used  Vaping Use  . Vaping Use: Never used  Substance Use Topics  . Alcohol use: Never  . Drug use: Never     Allergies   Patient has no known allergies.   Review of Systems Review of Systems  Constitutional: Positive for chills. Negative for activity change, appetite change, fatigue and fever.  HENT: Positive for congestion. Negative for ear pain, rhinorrhea, sinus  pressure, sore throat and trouble swallowing.   Eyes: Negative for discharge and redness.  Respiratory: Positive for cough. Negative for chest tightness and shortness of breath.   Cardiovascular: Negative for chest pain.  Gastrointestinal: Positive for diarrhea. Negative for abdominal pain, nausea and vomiting.  Musculoskeletal: Negative for myalgias.  Skin: Negative for rash.  Neurological: Negative for dizziness, light-headedness and headaches.     Physical Exam Triage Vital Signs ED Triage Vitals  Enc Vitals Group     BP 02/06/20 1129 (!) 167/112     Pulse Rate 02/06/20 1129 84     Resp 02/06/20 1129 16     Temp 02/06/20 1129 98.3 F (36.8 C)     Temp Source 02/06/20 1129 Oral     SpO2 02/06/20 1129 98 %     Weight --      Height --      Head Circumference --      Peak Flow --      Pain Score 02/06/20 1131 0     Pain Loc --      Pain Edu? --      Excl. in GC? --    No data found.  Updated Vital Signs BP (!) 167/112 (BP Location: Right Arm)   Pulse 84   Temp 98.3 F (36.8 C) (Oral)   Resp 16   SpO2 98%   Visual Acuity Right  Eye Distance:   Left Eye Distance:   Bilateral Distance:    Right Eye Near:   Left Eye Near:    Bilateral Near:     Physical Exam Vitals and nursing note reviewed.  Constitutional:      Appearance: He is well-developed.     Comments: No acute distress  HENT:     Head: Normocephalic and atraumatic.     Ears:     Comments: Bilateral ears without tenderness to palpation of external auricle, tragus and mastoid, EAC's without erythema or swelling, TM's with good bony landmarks and cone of light. Non erythematous.     Nose: Nose normal.     Mouth/Throat:     Comments: Oral mucosa pink and moist, no tonsillar enlargement or exudate. Posterior pharynx patent and nonerythematous, no uvula deviation or swelling. Normal phonation. Eyes:     Conjunctiva/sclera: Conjunctivae normal.  Cardiovascular:     Rate and Rhythm: Normal rate.    Pulmonary:     Effort: Pulmonary effort is normal. No respiratory distress.     Comments: Breathing comfortably at rest, CTABL, no wheezing, rales or other adventitious sounds auscultated Abdominal:     General: There is no distension.  Musculoskeletal:        General: Normal range of motion.     Cervical back: Neck supple.  Skin:    General: Skin is warm and dry.  Neurological:     Mental Status: He is alert and oriented to person, place, and time.      UC Treatments / Results  Labs (all labs ordered are listed, but only abnormal results are displayed) Labs Reviewed  SARS CORONAVIRUS 2 (TAT 6-24 HRS)    EKG   Radiology No results found.  Procedures Procedures (including critical care time)  Medications Ordered in UC Medications - No data to display  Initial Impression / Assessment and Plan / UC Course  I have reviewed the triage vital signs and the nursing notes.  Pertinent labs & imaging results that were available during my care of the patient were reviewed by me and considered in my medical decision making (see chart for details).     Covid PCR pending, suspect likely viral illness, recommend continued symptomatic and supportive care and monitor for continued gradual resolution.  Discussed strict return precautions. Patient verbalized understanding and is agreeable with plan.  Final Clinical Impressions(s) / UC Diagnoses   Final diagnoses:  Viral illness     Discharge Instructions     Covid test pending, monitor my chart for results Tessalon's every 8 hours for cough Mucinex DM for congestion/cough Tylenol and ibuprofen for any body aches, headache, fever Rest and fluids Follow-up if any symptoms not improving or worsening  Follow-up with primary care for recheck of blood pressure [place] [facility]    ED Prescriptions    Medication Sig Dispense Auth. Provider   benzonatate (TESSALON) 200 MG capsule Take 1 capsule (200 mg  total) by mouth 3 (three) times daily as needed for up to 7 days for cough. 28 capsule Jaqueline Uber C, PA-C   dextromethorphan-guaiFENesin (MUCINEX DM) 30-600 MG 12hr tablet Take 1 tablet by mouth 2 (two) times daily. 20 tablet Maicee Ullman, Daisetta C, PA-C     PDMP not reviewed this encounter.   Raynisha Avilla, [place] C, PA-C 02/06/20 1150

## 2020-02-06 NOTE — ED Triage Notes (Signed)
Patient with chills that started on last week. Diarrhea x 2 on yesterday none today. Patient denies cough,fever or SOB. Patient states that he gets COVID tested weekly. Last test on Wednesday. Patient states he has not taken BP medications in over 4 years.

## 2020-07-31 ENCOUNTER — Emergency Department (HOSPITAL_COMMUNITY)
Admission: EM | Admit: 2020-07-31 | Discharge: 2020-07-31 | Disposition: A | Discharge status: Home / Self Care | Payer: BC Managed Care – PPO | Attending: Emergency Medicine | Admitting: Emergency Medicine

## 2020-07-31 ENCOUNTER — Encounter (HOSPITAL_COMMUNITY): Payer: Self-pay

## 2020-07-31 ENCOUNTER — Other Ambulatory Visit: Payer: Self-pay

## 2020-07-31 ENCOUNTER — Emergency Department (HOSPITAL_COMMUNITY): Payer: BC Managed Care – PPO

## 2020-07-31 DIAGNOSIS — Z79899 Other long term (current) drug therapy: Secondary | ICD-10-CM | POA: Insufficient documentation

## 2020-07-31 DIAGNOSIS — R072 Precordial pain: Secondary | ICD-10-CM | POA: Diagnosis not present

## 2020-07-31 DIAGNOSIS — Z7982 Long term (current) use of aspirin: Secondary | ICD-10-CM | POA: Insufficient documentation

## 2020-07-31 DIAGNOSIS — I2510 Atherosclerotic heart disease of native coronary artery without angina pectoris: Secondary | ICD-10-CM | POA: Diagnosis not present

## 2020-07-31 DIAGNOSIS — I10 Essential (primary) hypertension: Secondary | ICD-10-CM | POA: Diagnosis not present

## 2020-07-31 DIAGNOSIS — R0789 Other chest pain: Secondary | ICD-10-CM | POA: Diagnosis present

## 2020-07-31 LAB — CBC
HCT: 45.9 % (ref 39.0–52.0)
Hemoglobin: 14.1 g/dL (ref 13.0–17.0)
MCH: 22 pg — ABNORMAL LOW (ref 26.0–34.0)
MCHC: 30.7 g/dL (ref 30.0–36.0)
MCV: 71.5 fL — ABNORMAL LOW (ref 80.0–100.0)
Platelets: 281 10*3/uL (ref 150–400)
RBC: 6.42 MIL/uL — ABNORMAL HIGH (ref 4.22–5.81)
RDW: 15 % (ref 11.5–15.5)
WBC: 5.8 10*3/uL (ref 4.0–10.5)
nRBC: 0 % (ref 0.0–0.2)

## 2020-07-31 LAB — BASIC METABOLIC PANEL
Anion gap: 8 (ref 5–15)
BUN: 15 mg/dL (ref 6–20)
CO2: 26 mmol/L (ref 22–32)
Calcium: 10 mg/dL (ref 8.9–10.3)
Chloride: 103 mmol/L (ref 98–111)
Creatinine, Ser: 0.95 mg/dL (ref 0.61–1.24)
GFR, Estimated: >60 mL/min (ref >60)
Glucose, Bld: 143 mg/dL — ABNORMAL HIGH (ref 70–99)
Potassium: 4 mmol/L (ref 3.5–5.1)
Sodium: 137 mmol/L (ref 135–145)

## 2020-07-31 LAB — HEPATIC FUNCTION PANEL
ALT: 39 U/L (ref 0–44)
AST: 31 U/L (ref 15–41)
Albumin: 3.8 g/dL (ref 3.5–5.0)
Alkaline Phosphatase: 37 U/L — ABNORMAL LOW (ref 38–126)
Bilirubin, Direct: 0.1 mg/dL (ref 0.0–0.2)
Indirect Bilirubin: 0.5 mg/dL (ref 0.3–0.9)
Total Bilirubin: 0.6 mg/dL (ref 0.3–1.2)
Total Protein: 7 g/dL (ref 6.5–8.1)

## 2020-07-31 LAB — TROPONIN I (HIGH SENSITIVITY)
Troponin I (High Sensitivity): 7 ng/L (ref <18)
Troponin I (High Sensitivity): 7 ng/L (ref <18)

## 2020-07-31 LAB — LIPASE, BLOOD: Lipase: 28 U/L (ref 11–51)

## 2020-07-31 MED ORDER — ASPIRIN 81 MG PO CHEW: 81.0000 mg | CHEWABLE_TABLET | Freq: Every day | ORAL | 0 refills | Status: DC

## 2020-07-31 MED ORDER — NITROGLYCERIN 0.4 MG SL SUBL
0.4000 mg | SUBLINGUAL_TABLET | SUBLINGUAL | Status: DC | PRN
  Administered 2020-07-31: 0.4 mg via SUBLINGUAL
  Filled 2020-07-31: qty 1

## 2020-07-31 MED ORDER — OMEPRAZOLE 20 MG PO CPDR: 20.0000 mg | DELAYED_RELEASE_CAPSULE | Freq: Every day | ORAL | 0 refills | Status: AC

## 2020-07-31 MED ORDER — ACETAMINOPHEN 500 MG PO TABS: 1000.0000 mg | ORAL_TABLET | Freq: Four times a day (QID) | ORAL | 0 refills | Status: DC | PRN

## 2020-07-31 MED ORDER — ASPIRIN 81 MG PO CHEW
324.0000 mg | CHEWABLE_TABLET | Freq: Once | ORAL | Status: AC
  Administered 2020-07-31: 324 mg via ORAL
  Filled 2020-07-31: qty 4

## 2020-07-31 MED ORDER — KETOROLAC TROMETHAMINE 30 MG/ML IJ SOLN
30.0000 mg | Freq: Once | INTRAMUSCULAR | Status: AC
  Administered 2020-07-31: 30 mg via INTRAVENOUS
  Filled 2020-07-31: qty 1

## 2020-07-31 NOTE — ED Triage Notes (Signed)
Patient complains of chest tightness since 0400. Patient denies any illness and denies any associated symptoms. NAD

## 2020-07-31 NOTE — ED Provider Notes (Signed)
[name] EMERGENCY DEPARTMENT Provider Note   CSN: [id number] Arrival date & time: 07/31/20  8413     History No chief complaint on file.   Brandon Henson is a 55 y.o. male.  HPI Patient reports that he developed chest pain at 4 AM this morning.  He reports it is somewhat to the center and left of his chest.  Its that tightness and a intense pressure at the same time.  Reports it was much worse initially when it started about 4.  Has been waxing and waning since that time.  No associated symptoms.  No nausea no vomiting.  No shortness of breath no lightheadedness.  No abdominal pain.  No radiation into the back neck or arms.  Patient did not try any medications for his symptoms.  No history of similar chest pain.  Patient denies any recent problems with exertional shortness of breath or chest pain.  He reports he got an episode of severe pain in 2005 when he was in [place].  Patient reports he had a cardiac catheterization at that time.  He reports there were no stents needed.  He has not had other cardiac work-up since then.  He denies any reflux symptoms.  He does not have pain with eating.  Reports he ate a carb controlled meal of spinach and sausage for [employer] last night.  Patient reports he takes blood pressure medications and is compliant.  Blood pressures are typically well controlled with systolics 130s or less.  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  He does not do heavy physical exertion.  He has not noted any recent chest pain or shortness of breath with typical activities.    Past Medical History:  Diagnosis Date  . Coronary artery disease   . Hypertension     There are no problems to display for this patient.   History reviewed. No pertinent surgical history.     Family History  Problem Relation Age of Onset  . Healthy Mother   . Healthy Father     Social History   Tobacco Use  . Smoking status: Never Smoker  . Smokeless tobacco:  Never Used  Vaping Use  . Vaping Use: Never used  Substance Use Topics  . Alcohol use: Never  . Drug use: Never    Home Medications Prior to Admission medications   Medication Sig Start Date End Date Taking? Authorizing Provider  acetaminophen (TYLENOL) 500 MG tablet Take 2 tablets (1,000 mg total) by mouth every 6 (six) hours as needed. 07/31/20  Yes Arby Barrette, MD  amLODipine (NORVASC) 10 MG tablet Take 10 mg by mouth daily. 04/29/20  Yes [provider]  aspirin 81 MG chewable tablet Chew 1 tablet (81 mg total) by mouth daily. 07/31/20  Yes Arby Barrette, MD  atorvastatin (LIPITOR) 10 MG tablet Take 10 mg by mouth daily. 06/21/20  Yes [provider]  metFORMIN (GLUCOPHAGE) 500 MG tablet Take 500 mg by mouth 2 (two) times daily. 06/04/20  Yes [provider]  omeprazole (PRILOSEC) 20 MG capsule Take 1 capsule (20 mg total) by mouth daily. 07/31/20  Yes Arby Barrette, MD  sildenafil (VIAGRA) 50 MG tablet Take 50 mg by mouth as needed for erectile dysfunction. 07/18/20  Yes [provider]    Allergies    Patient has no known allergies.  Review of Systems   Review of Systems 10 systems reviewed and negative except as per HPI Physical Exam Updated Vital Signs BP 129/87  Pulse 65   Temp 99.1 F (37.3 C)   Resp (!) 22   SpO2 99%   Physical Exam Constitutional:      Appearance: He is well-developed.  HENT:     Head: Normocephalic and atraumatic.  Eyes:     Extraocular Movements: Extraocular movements intact.     Conjunctiva/sclera: Conjunctivae normal.  Cardiovascular:     Rate and Rhythm: Normal rate and regular rhythm.     Heart sounds: Normal heart sounds.  Pulmonary:     Effort: Pulmonary effort is normal.     Breath sounds: Normal breath sounds.     Comments: Patient does have reproducible chest discomfort to the left sternocostal margin. Chest:     Chest wall: Tenderness present.  Abdominal:     General: Bowel sounds are  normal. There is no distension.     Palpations: Abdomen is soft.     Tenderness: There is no abdominal tenderness.  Musculoskeletal:        General: Normal range of motion.     Cervical back: Neck supple.  Skin:    General: Skin is warm and dry.  Neurological:     Mental Status: He is alert and oriented to person, place, and time.     GCS: GCS eye subscore is 4. GCS verbal subscore is 5. GCS motor subscore is 6.     Coordination: Coordination normal.     ED Results / Procedures / Treatments   Labs (all labs ordered are listed, but only abnormal results are displayed) Labs Reviewed  BASIC METABOLIC PANEL - Abnormal; Notable for the following components:      Result Value   Glucose, Bld 143 (*)    All other components within normal limits  CBC - Abnormal; Notable for the following components:   RBC 6.42 (*)    MCV 71.5 (*)    MCH 22.0 (*)    All other components within normal limits  HEPATIC FUNCTION PANEL - Abnormal; Notable for the following components:   Alkaline Phosphatase 37 (*)    All other components within normal limits  LIPASE, BLOOD  TROPONIN I (HIGH SENSITIVITY)  TROPONIN I (HIGH SENSITIVITY)    EKG EKG Interpretation  Date/Time:  Wednesday July 31 2020 09:50:39 EDT Ventricular Rate:  80 PR Interval:  154 QRS Duration: 88 QT Interval:  368 QTC Calculation: 424 R Axis:   -64 Text Interpretation: Normal sinus rhythm Left anterior fascicular block Abnormal ECG no acute ischemic appearance, no change from previous Confirmed by Arby Barrette [id number]) on 07/31/2020 11:03:17 AM   Radiology DG Chest 2 View  Result Date: 07/31/2020 CLINICAL DATA:  55 year old male with chest pain. EXAM: CHEST - 2 VIEW COMPARISON:  07/11/2019 FINDINGS: The mediastinal contours are within normal limits. No cardiomegaly. The lungs are clear bilaterally without evidence of focal consolidation, pleural effusion, or pneumothorax. No acute osseous abnormality. IMPRESSION: No acute  cardiopulmonary process. Electronically Signed   By: Marliss Coots MD   On: 07/31/2020 10:31    Procedures Procedures   Medications Ordered in ED Medications  nitroGLYCERIN (NITROSTAT) SL tablet 0.4 mg (0.4 mg Sublingual Given 07/31/20 1234)  aspirin chewable tablet 324 mg (324 mg Oral Given 07/31/20 1233)  ketorolac (TORADOL) 30 MG/ML injection 30 mg (30 mg Intravenous Given 07/31/20 1440)    ED Course  I have reviewed the triage vital signs and the nursing notes.  Pertinent labs & imaging results that were available during my care of the patient were reviewed by  me and considered in my medical decision making (see chart for details).    MDM Rules/Calculators/A&P                          Patient had chest pain starting 4 AM.  He does not have associated symptoms.  Pain does have reproducible element.  Patient has some risk factors for coronary artery disease with history of hypertension but well controlled on medications.  No family history.  Patient is not a smoker. 2 Sets of troponins are negative.  EKG does not have ischemic appearance.  At this time we will proceed with treating with the omeprazole and empiric Henson aspirin until follow-up with PCP for further evaluation and referral for cardiac stress test.  Patient is instructed use Tylenol if needed for pain that has definitive musculoskeletal quality of worse with movement or pressure. careful return precautions reviewed. Final Clinical Impression(s) / ED Diagnoses Final diagnoses:  Precordial chest pain    Rx / DC Orders ED Discharge Orders         Ordered    aspirin 81 MG chewable tablet  Daily        07/31/20 1514    omeprazole (PRILOSEC) 20 MG capsule  Daily        07/31/20 1514    acetaminophen (TYLENOL) 500 MG tablet  Every 6 hours PRN        07/31/20 1514           Arby Barrette, MD 07/31/20 1518

## 2020-07-31 NOTE — Discharge Instructions (Signed)
1.  Make a appointment to see your doctor within the next 1 to 2 days.  You need a recheck and possible referral for a cardiac stress test. 2.  Take omeprazole daily as prescribed.  Take this 30 minutes before you eat anything in the morning.  Eat a low-fat diet.  Eat small nutritious meals spread out over the course of the day. 3.  If you are having pain in your chest that is worse with certain movements and pushing on your chest, take extra strength Tylenol every 6 hours as needed. 4.  If you are getting episodes of chest pain that are increasing in pressure, tightness and or experiencing shortness of breath, lightheadedness, nausea return to the emergency department for recheck.

## 2020-07-31 NOTE — ED Notes (Signed)
Patient transported to X-ray 

## 2020-09-18 ENCOUNTER — Ambulatory Visit
Admission: RE | Admit: 2020-09-18 | Discharge: 2020-09-18 | Disposition: A | Discharge status: Home / Self Care | Payer: BC Managed Care – PPO | Source: Ambulatory Visit | Attending: Family Medicine | Admitting: Family Medicine

## 2020-09-18 ENCOUNTER — Other Ambulatory Visit: Payer: Self-pay | Admitting: Family Medicine

## 2020-09-18 DIAGNOSIS — R059 Cough, unspecified: Secondary | ICD-10-CM

## 2020-09-19 NOTE — Progress Notes (Deleted)
Cardiology Office Note:    Date:  09/19/2020   ID:  Brandon Henson, DOB [birth year], MRN [id number]  PCP:  Patient, No Pcp Per (Inactive)  Cardiologist:  None  Electrophysiologist:  None   Referring MD: Brandon Bussing, MD   No chief complaint on file. ***  History of Present Illness:    Brandon Henson is a 55 y.o. male with a hx of hypertension, reported history of CAD who is referred by Dr. Docia Chuck for evaluation of chest pain.  He was seen in the ED with chest pain on 07/31/2020.  EKG unremarkable, troponins negative.  Past Medical History:  Diagnosis Date  . Coronary artery disease   . Hypertension     No past surgical history on file.  Current Medications: No outpatient medications have been marked as taking for the 09/20/20 encounter (Appointment) with Brandon Ishikawa, MD.     Allergies:   Patient has no known allergies.   Social History   Socioeconomic History  . Marital status: Single    Spouse name: Not on file  . Number of children: Not on file  . Years of education: Not on file  . Highest education level: Not on file  Occupational History  . Not on file  Tobacco Use  . Smoking status: Never Smoker  . Smokeless tobacco: Never Used  Vaping Use  . Vaping Use: Never used  Substance and Sexual Activity  . Alcohol use: Never  . Drug use: Never  . Sexual activity: Yes    Birth control/protection: None  Other Topics Concern  . Not on file  Social History Narrative  . Not on file   Social Determinants of Health   Financial Resource Strain: Not on file  Food Insecurity: Not on file  Transportation Needs: Not on file  Physical Activity: Not on file  Stress: Not on file  Social Connections: Not on file     Family History: The patient's ***family history includes Healthy in his father and mother.  ROS:   Please see the history of present illness.    *** All other systems reviewed and are negative.  EKGs/Labs/Other Studies Reviewed:    The  following studies were reviewed today: ***  EKG:  EKG is *** ordered today.  The ekg ordered today demonstrates ***  Recent Labs: 07/31/2020: ALT 39; BUN 15; Creatinine, Ser 0.95; Hemoglobin 14.1; Platelets 281; Potassium 4.0; Sodium 137  Recent Lipid Panel No results found for: CHOL, TRIG, HDL, CHOLHDL, VLDL, LDLCALC, LDLDIRECT  Physical Exam:    VS:  There were no vitals taken for this visit.    Wt Readings from Last 3 Encounters:  07/11/19 (!) 365 lb (165.6 kg)  03/17/18 (!) 365 lb (165.6 kg)     GEN: *** Well nourished, well developed in no acute distress HEENT: Normal NECK: No JVD; No carotid bruits LYMPHATICS: No lymphadenopathy CARDIAC: ***RRR, no murmurs, rubs, gallops RESPIRATORY:  Clear to auscultation without rales, wheezing or rhonchi  ABDOMEN: Soft, non-tender, non-distended MUSCULOSKELETAL:  No edema; No deformity  SKIN: Warm and dry NEUROLOGIC:  Alert and oriented x 3 PSYCHIATRIC:  Normal affect   ASSESSMENT:    No diagnosis found. PLAN:    Chest pain:  Hypertension: On amlodipine 10 mg daily  Hyperlipidemia: On atorvastatin 10 mg daily  RTC in***  Medication Adjustments/Labs and Tests Ordered: Current medicines are reviewed at length with the patient today.  Concerns regarding medicines are outlined above.  No orders of the defined types were placed in  this encounter.  No orders of the defined types were placed in this encounter.   There are no Patient Instructions on file for this visit.   Signed, Brandon Ishikawa, MD  09/19/2020 10:42 PM    [facility]

## 2020-09-20 ENCOUNTER — Ambulatory Visit (INDEPENDENT_AMBULATORY_CARE_PROVIDER_SITE_OTHER): Payer: 59 | Admitting: Cardiology

## 2020-09-20 ENCOUNTER — Encounter: Payer: Self-pay | Admitting: Cardiology

## 2020-09-20 ENCOUNTER — Other Ambulatory Visit: Payer: Self-pay

## 2020-09-20 VITALS — BP 124/78 | HR 86 | Ht 73.0 in | Wt 360.0 lb

## 2020-09-20 DIAGNOSIS — R079 Chest pain, unspecified: Secondary | ICD-10-CM

## 2020-09-20 DIAGNOSIS — R400 Somnolence: Secondary | ICD-10-CM

## 2020-09-20 DIAGNOSIS — I10 Essential (primary) hypertension: Secondary | ICD-10-CM | POA: Diagnosis not present

## 2020-09-20 DIAGNOSIS — R0683 Snoring: Secondary | ICD-10-CM

## 2020-09-20 DIAGNOSIS — E785 Hyperlipidemia, unspecified: Secondary | ICD-10-CM

## 2020-09-20 NOTE — Progress Notes (Signed)
Cardiology Office Note:    Date:  09/20/2020   ID:  Brandon Henson, DOB [birth year], MRN [id number]  PCP:  Patient, No Pcp Per (Inactive)  Cardiologist:  None  Electrophysiologist:  None   Referring MD: Darrow Bussing, MD   Chief Complaint  Patient presents with  . New Patient (Initial Visit)  . Chest Pain    History of Present Illness:    Brandon Henson is a 55 y.o. male with a hx of diabetes, hyperlipidemia, hypertension, who is referred by Dr. Docia Chuck for evaluation of chest pain.  He was seen in the ED with chest pain on 07/31/2020.  EKG unremarkable, troponins negative.  Today, he reports his hypertension is usually stable, and he found out about his diabetes within the past year. In 2004 he had a heart catheterization which found no abnormalities. Since his visit to the ED he has not had any more episodes of chest pain. Prior to the ED visit, he had one episode of left-sided chest pain with a duration of hours (all day). He reports feeling chest tightness at that time and he did not notice any triggers for worsening symptoms. He has been told that he snores and he does get fatigued during the day. He does not have a past sleep study. He does not participate in formal exercise. 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 During work he denies any exertional shortness of breath. He has minor LE edema. He was never a smoker and does not drink alcohol. In his family there is a history of hypertension, but no known history of cardiac disease. He denies any palpitations, syncope, lightheadedness, orthopnea or PND.   Past Medical History:  Diagnosis Date  . Coronary artery disease   . Hypertension     No past surgical history on file.  Current Medications: Current Meds  Medication Sig  . acetaminophen (TYLENOL) 500 MG tablet Take 2 tablets (1,000 mg total) by mouth every 6 (six) hours as needed.  Marland Kitchen amLODipine (NORVASC) 10 MG tablet  Take 10 mg by mouth daily.  Marland Kitchen aspirin 81 MG chewable tablet Chew 1 tablet (81 mg total) by mouth daily.  Marland Kitchen atorvastatin (LIPITOR) 10 MG tablet Take 10 mg by mouth daily.  . metFORMIN (GLUCOPHAGE) 500 MG tablet Take 500 mg by mouth 2 (two) times daily.  Marland Kitchen omeprazole (PRILOSEC) 20 MG capsule Take 1 capsule (20 mg total) by mouth daily.  . sildenafil (VIAGRA) 50 MG tablet Take 50 mg by mouth as needed for erectile dysfunction.     Allergies:   Patient has no known allergies.   Social History   Socioeconomic History  . Marital status: Single    Spouse name: Not on file  . Number of children: Not on file  . Years of education: Not on file  . Highest education level: Not on file  Occupational History  . Not on file  Tobacco Use  . Smoking status: Never Smoker  . Smokeless tobacco: Never Used  Vaping Use  . Vaping Use: Never used  Substance and Sexual Activity  . Alcohol use: Never  . Drug use: Never  . Sexual activity: Yes    Birth control/protection: None  Other Topics Concern  . Not on file  Social History Narrative  . Not on file   Social Determinants of Health   Financial Resource Strain: Not on file  Food Insecurity: Not on file  Transportation Needs: Not  on file  Physical Activity: Not on file  Stress: Not on file  Social Connections: Not on file     Family History: The patient's family history includes Healthy in his father and mother.  ROS:   Please see the history of present illness.    (+) Left chest pain (+) Chest tightness (+) Snores (+) Fatigue (+) LE edema All other systems reviewed and are negative.  EKGs/Labs/Other Studies Reviewed:    The following studies were reviewed today:   EKG:   09/20/2020: NSR, rate 86, LAD, No ST abnormalities  Recent Labs: 07/31/2020: ALT 39; BUN 15; Creatinine, Ser 0.95; Hemoglobin 14.1; Platelets 281; Potassium 4.0; Sodium 137  Recent Lipid Panel No results found for: CHOL, TRIG, HDL, CHOLHDL, VLDL, LDLCALC,  LDLDIRECT  Physical Exam:    VS:  BP 124/78 (BP Location: Left Arm, Patient Position: Sitting, Cuff Size: Large)   Pulse 86   Ht 6\' 1"  (1.854 m)   Wt (!) 360 lb (163.3 kg)   BMI 47.50 kg/m     Wt Readings from Last 3 Encounters:  09/20/20 (!) 360 lb (163.3 kg)  07/11/19 (!) 365 lb (165.6 kg)  03/17/18 (!) 365 lb (165.6 kg)     GEN: Well nourished, well developed in no acute distress HEENT: Normal NECK: No JVD; No carotid bruits LYMPHATICS: No lymphadenopathy CARDIAC: RRR, no murmurs, rubs, gallops RESPIRATORY:  Clear to auscultation without rales, wheezing or rhonchi  ABDOMEN: Soft, non-tender, non-distended MUSCULOSKELETAL:  No edema; No deformity  SKIN: Warm and dry NEUROLOGIC:  Alert and oriented x 3 PSYCHIATRIC:  Normal affect   ASSESSMENT:    1. Chest pain of uncertain etiology   2. Essential hypertension   3. Hyperlipidemia, unspecified hyperlipidemia type   4. Snoring   5. Daytime somnolence    PLAN:    Chest pain: Atypical in description but does have CAD risk factors (age, hypertension, hyperlipidemia, T2DM).  Overall would classify as intermediate risk for obstructive CAD and further evaluation recommended -Lexiscan Myoview to evaluate for ischemia  Hypertension: On amlodipine 10 mg daily.  Appears controlled  Hyperlipidemia: On atorvastatin 10 mg daily.  LDL 77 on 09/17/2020.  Will check calcium score to guide how aggressive to be lowering cholesterol.  T2DM: A1c 7.1% on 09/17/2020.  On metformin  Snoring/daytime somnolence: Will check sleep study  RTC in 3 months  Shared Decision Making/Informed Consent The risks [chest pain, shortness of breath, cardiac arrhythmias, dizziness, blood pressure fluctuations, myocardial infarction, stroke/transient ischemic attack, nausea, vomiting, allergic reaction, radiation exposure, metallic taste sensation and life-threatening complications (estimated to be 1 in 10,000)], benefits (risk stratification, diagnosing  coronary artery disease, treatment guidance) and alternatives of a nuclear stress test were discussed in detail with Brandon Henson and he agrees to proceed.      Medication Adjustments/Labs and Tests Ordered: Current medicines are reviewed at length with the patient today.  Concerns regarding medicines are outlined above.  Orders Placed This Encounter  Procedures  . CT CARDIAC SCORING (SELF PAY ONLY)  . MYOCARDIAL PERFUSION IMAGING  . EKG 12-Lead  . Split night study   No orders of the defined types were placed in this encounter.   Patient Instructions  Medication Instructions:  Your physician recommends that you continue on your current medications as directed. Please refer to the Current Medication list given to you today.  *If you need a refill on your cardiac medications before your next appointment, please call your pharmacy*  Testing/Procedures: Your physician has requested that  you have a Scientist, physiological (at [facility]). For further information please visit [email or web address] Please follow instruction sheet, as given.   How to prepare for your Myocardial Perfusion Test:  Do not eat or drink 3 hours prior to your test, except you may have water.  Do not consume products containing caffeine (regular or decaffeinated) 12 hours prior to your test. (ex: coffee, chocolate, sodas, tea).  Do bring a list of your current medications with you.  If not listed below, you may take your medications as normal.  Do wear comfortable clothes (no dresses or overalls) and walking shoes, tennis shoes preferred (No heels or open toe shoes are allowed).  Do NOT wear cologne, perfume, aftershave, or lotions (deodorant is allowed).  The test will take approximately 3 to 4 hours to complete  If these instructions are not followed, your test will have to be rescheduled.  CT coronary calcium score. This test is done at [address] [facility] [address]. This is $99 out of  pocket.   Coronary CalciumScan A coronary calcium scan is an imaging test used to look for deposits of calcium and other fatty materials (plaques) in the inner lining of the blood vessels of the heart (coronary arteries). These deposits of calcium and plaques can partly clog and narrow the coronary arteries without producing any symptoms or warning signs. This puts a person at risk for a heart attack. This test can detect these deposits before symptoms develop. Tell a health care provider about:  Any allergies you have.  All medicines you are taking, including vitamins, herbs, eye drops, creams, and over-the-counter medicines.  Any problems you or family members have had with anesthetic medicines.  Any blood disorders you have.  Any surgeries you have had.  Any medical conditions you have.  Whether you are pregnant or may be pregnant. What are the risks? Generally, this is a safe procedure. However, problems may occur, including:  Harm to a pregnant woman and her unborn baby. This test involves the use of radiation. Radiation exposure can be dangerous to a pregnant woman and her unborn baby. If you are pregnant, you generally should not have this procedure done.  Slight increase in the risk of cancer. This is because of the radiation involved in the test. What happens before the procedure? No preparation is needed for this procedure. What happens during the procedure?  You will undress and remove any jewelry around your neck or chest.  You will put on a hospital gown.  Sticky electrodes will be placed on your chest. The electrodes will be connected to an electrocardiogram (ECG) machine to record a tracing of the electrical activity of your heart.  A CT scanner will take pictures of your heart. During this time, you will be asked to lie still and hold your breath for 2-3 seconds while a picture of your heart is being taken. The procedure may vary among health care providers and  hospitals. What happens after the procedure?  You can get dressed.  You can return to your normal activities.  It is up to you to get the results of your test. Ask your health care provider, or the department that is doing the test, when your results will be ready. Summary  A coronary calcium scan is an imaging test used to look for deposits of calcium and other fatty materials (plaques) in the inner lining of the blood vessels of the heart (coronary arteries).  Generally, this is a safe procedure. Tell  your health care provider if you are pregnant or may be pregnant.  No preparation is needed for this procedure.  A CT scanner will take pictures of your heart.  You can return to your normal activities after the scan is done. This information is not intended to replace advice given to you by your health care provider. Make sure you discuss any questions you have with your health care provider. Document Released: 10/24/2007 Document Revised: 03/16/2016 Document Reviewed: 03/16/2016 Elsevier Interactive Patient Education  2017 [employer].  Your physician has recommended that you have a sleep study. This test records several body functions during sleep, including: brain activity, eye movement, oxygen and carbon dioxide blood levels, heart rate and rhythm, breathing rate and rhythm, the flow of air through your mouth and nose, snoring, body muscle movements, and chest and belly movement.  Follow-Up: At [facility] , you and your health needs are our priority.  As part of our continuing mission to provide you with exceptional heart care, we have created designated Provider Care Teams.  These Care Teams include your primary Cardiologist (physician) and Advanced Practice Providers (APPs -  Physician Assistants and Nurse Practitioners) who all work together to provide you with the care you need, when you need it.  We recommend signing up for the patient portal called "MyChart".  Sign up  information is provided on this After Visit Summary.  MyChart is used to connect with patients for Virtual Visits (Telemedicine).  Patients are able to view lab/test results, encounter notes, upcoming appointments, etc.  Non-urgent messages can be sent to your provider as well.   To learn more about what you can do with MyChart, go to [email or web address].    Your next appointment:   3 month(s)  The format for your next appointment:   In Person  Provider:   Epifanio Lesches, MD       [facility] Stumpf,acting as a scribe for Little Ishikawa, MD.,have documented all relevant documentation on the behalf of Little Ishikawa, MD,as directed by  Little Ishikawa, MD while in the presence of Little Ishikawa, MD.  I, Little Ishikawa, MD, have reviewed all documentation for this visit. The documentation on 09/20/20 for the exam, diagnosis, procedures, and orders are all accurate and complete.   Signed, Little Ishikawa, MD  09/20/2020 3:17 PM    [facility]

## 2020-09-20 NOTE — Patient Instructions (Signed)
Medication Instructions:  Your physician recommends that you continue on your current medications as directed. Please refer to the Current Medication list given to you today.  *If you need a refill on your cardiac medications before your next appointment, please call your pharmacy*  Testing/Procedures: Your physician has requested that you have a Scientist, physiological (at [facility]). For further information please visit [email or web address] Please follow instruction sheet, as given.   How to prepare for your Myocardial Perfusion Test:  Do not eat or drink 3 hours prior to your test, except you may have water.  Do not consume products containing caffeine (regular or decaffeinated) 12 hours prior to your test. (ex: coffee, chocolate, sodas, tea).  Do bring a list of your current medications with you.  If not listed below, you may take your medications as normal.  Do wear comfortable clothes (no dresses or overalls) and walking shoes, tennis shoes preferred (No heels or open toe shoes are allowed).  Do NOT wear cologne, perfume, aftershave, or lotions (deodorant is allowed).  The test will take approximately 3 to 4 hours to complete  If these instructions are not followed, your test will have to be rescheduled.  CT coronary calcium score. This test is done at [address]. This is $99 out of pocket.   Coronary CalciumScan A coronary calcium scan is an imaging test used to look for deposits of calcium and other fatty materials (plaques) in the inner lining of the blood vessels of the heart (coronary arteries). These deposits of calcium and plaques can partly clog and narrow the coronary arteries without producing any symptoms or warning signs. This puts a person at risk for a heart attack. This test can detect these deposits before symptoms develop. Tell a health care provider about:  Any allergies you have.  All medicines you are taking, including vitamins, herbs, eye  drops, creams, and over-the-counter medicines.  Any problems you or family members have had with anesthetic medicines.  Any blood disorders you have.  Any surgeries you have had.  Any medical conditions you have.  Whether you are pregnant or may be pregnant. What are the risks? Generally, this is a safe procedure. However, problems may occur, including:  Harm to a pregnant woman and her unborn baby. This test involves the use of radiation. Radiation exposure can be dangerous to a pregnant woman and her unborn baby. If you are pregnant, you generally should not have this procedure done.  Slight increase in the risk of cancer. This is because of the radiation involved in the test. What happens before the procedure? No preparation is needed for this procedure. What happens during the procedure?  You will undress and remove any jewelry around your neck or chest.  You will put on a hospital gown.  Sticky electrodes will be placed on your chest. The electrodes will be connected to an electrocardiogram (ECG) machine to record a tracing of the electrical activity of your heart.  A CT scanner will take pictures of your heart. During this time, you will be asked to lie still and hold your breath for 2-3 seconds while a picture of your heart is being taken. The procedure may vary among health care providers and hospitals. What happens after the procedure?  You can get dressed.  You can return to your normal activities.  It is up to you to get the results of your test. Ask your health care provider, or the department that is doing the  test, when your results will be ready. Summary  A coronary calcium scan is an imaging test used to look for deposits of calcium and other fatty materials (plaques) in the inner lining of the blood vessels of the heart (coronary arteries).  Generally, this is a safe procedure. Tell your health care provider if you are pregnant or may be pregnant.  No  preparation is needed for this procedure.  A CT scanner will take pictures of your heart.  You can return to your normal activities after the scan is done. This information is not intended to replace advice given to you by your health care provider. Make sure you discuss any questions you have with your health care provider. Document Released: 10/24/2007 Document Revised: 03/16/2016 Document Reviewed: 03/16/2016 Elsevier Interactive Patient Education  2017 [employer].  Your physician has recommended that you have a sleep study. This test records several body functions during sleep, including: brain activity, eye movement, oxygen and carbon dioxide blood levels, heart rate and rhythm, breathing rate and rhythm, the flow of air through your mouth and nose, snoring, body muscle movements, and chest and belly movement.  Follow-Up: At [facility], you and your health needs are our priority.  As part of our continuing mission to provide you with exceptional heart care, we have created designated Provider Care Teams.  These Care Teams include your primary Cardiologist (physician) and Advanced Practice Providers (APPs -  Physician Assistants and Nurse Practitioners) who all work together to provide you with the care you need, when you need it.  We recommend signing up for the patient portal called "MyChart".  Sign up information is provided on this After Visit Summary.  MyChart is used to connect with patients for Virtual Visits (Telemedicine).  Patients are able to view lab/test results, encounter notes, upcoming appointments, etc.  Non-urgent messages can be sent to your provider as well.   To learn more about what you can do with MyChart, go to [email or web address].    Your next appointment:   3 month(s)  The format for your next appointment:   In Person  Provider:   Epifanio Lesches, MD

## 2020-09-26 ENCOUNTER — Other Ambulatory Visit: Payer: Self-pay | Admitting: Cardiology

## 2020-09-26 ENCOUNTER — Telehealth: Payer: Self-pay | Admitting: *Deleted

## 2020-09-26 DIAGNOSIS — E6609 Other obesity due to excess calories: Secondary | ICD-10-CM

## 2020-09-26 DIAGNOSIS — R0683 Snoring: Secondary | ICD-10-CM

## 2020-09-26 DIAGNOSIS — I10 Essential (primary) hypertension: Secondary | ICD-10-CM

## 2020-09-26 DIAGNOSIS — R400 Somnolence: Secondary | ICD-10-CM

## 2020-09-26 NOTE — Telephone Encounter (Signed)
-----   Message from Hayley A Sharpe, RN sent at 09/20/2020  4:15 PM EDT ----- Regarding: sleep study Sleep study ordered per Dr. Schumann  Thanks!  

## 2020-09-26 NOTE — Telephone Encounter (Signed)
-----   Message from Harvel Ricks, RN sent at 09/20/2020  4:15 PM EDT ----- Regarding: sleep study Sleep study ordered per Dr. Bjorn Pippin  Thanks!

## 2020-09-26 NOTE — Telephone Encounter (Signed)
Prior Authorization for HST sent to [facility] via [employer]  web portal. Authorization Tracking Number :[id number].  Prior Authorization for HST sent to [facility]  via web portal. Per web portal no PA is required.

## 2020-10-14 NOTE — Telephone Encounter (Signed)
HST denied by [facility] twice. Will defer to MD for further recommendations.

## 2020-10-16 NOTE — Telephone Encounter (Signed)
Per Erskine Squibb in billing file HST first with [facility]. Once denial received then file [employer]. Called and left message for patient to call in reference to HST appointment. Details.

## 2020-10-22 ENCOUNTER — Telehealth (HOSPITAL_COMMUNITY): Payer: Self-pay | Admitting: *Deleted

## 2020-10-22 NOTE — Telephone Encounter (Signed)
Left message on voicemail in reference to upcoming appointment scheduled for 10/28/20. Phone number given for a call back so details instructions can be given. Daneil Dolin

## 2020-10-28 ENCOUNTER — Inpatient Hospital Stay: Admission: RE | Admit: 2020-10-28 | Payer: Self-pay | Source: Ambulatory Visit

## 2020-10-28 ENCOUNTER — Ambulatory Visit (HOSPITAL_COMMUNITY): Payer: 59

## 2020-10-29 ENCOUNTER — Ambulatory Visit (HOSPITAL_COMMUNITY): Payer: 59

## 2020-11-22 ENCOUNTER — Ambulatory Visit (HOSPITAL_BASED_OUTPATIENT_CLINIC_OR_DEPARTMENT_OTHER): Payer: BC Managed Care – PPO | Attending: Cardiology | Admitting: Cardiovascular Disease

## 2020-11-22 ENCOUNTER — Other Ambulatory Visit: Payer: Self-pay

## 2020-11-22 DIAGNOSIS — G4733 Obstructive sleep apnea (adult) (pediatric): Secondary | ICD-10-CM | POA: Insufficient documentation

## 2020-11-22 DIAGNOSIS — G4736 Sleep related hypoventilation in conditions classified elsewhere: Secondary | ICD-10-CM | POA: Insufficient documentation

## 2020-11-22 DIAGNOSIS — R400 Somnolence: Secondary | ICD-10-CM

## 2020-11-22 DIAGNOSIS — I10 Essential (primary) hypertension: Secondary | ICD-10-CM

## 2020-11-22 DIAGNOSIS — R0683 Snoring: Secondary | ICD-10-CM | POA: Diagnosis present

## 2020-11-22 DIAGNOSIS — E6609 Other obesity due to excess calories: Secondary | ICD-10-CM

## 2020-12-10 ENCOUNTER — Encounter (HOSPITAL_BASED_OUTPATIENT_CLINIC_OR_DEPARTMENT_OTHER): Payer: Self-pay | Admitting: Cardiovascular Disease

## 2020-12-10 NOTE — Procedures (Signed)
      Patient Name: Brandon Henson, Brandon Henson Date: 11/22/2020 Gender: Male D.O.B: March 22, 1966 Age (years): 54 Referring Provider: Epifanio Lesches Height (inches): 73 Interpreting Physician: Nicki Guadalajara MD, ABSM Weight (lbs): 350 RPSGT: Makena Sink BMI: 46 MRN: [id number] Neck Size: 19.00  CLINICAL INFORMATION Sleep Study Type: HST  Indication for sleep study: snoring, non-restorative sleep, daytime sleepiness  Epworth Sleepiness Score: 16  SLEEP STUDY TECHNIQUE A multi-channel overnight portable sleep study was performed. The channels recorded were: nasal airflow, thoracic respiratory movement, and oxygen saturation with a pulse oximetry. Snoring was also monitored.  MEDICATIONS acetaminophen (TYLENOL) 500 MG tablet amLODipine (NORVASC) 10 MG tablet aspirin 81 MG chewable tablet atorvastatin (LIPITOR) 10 MG tablet metFORMIN (GLUCOPHAGE) 500 MG tablet omeprazole (PRILOSEC) 20 MG capsule sildenafil (VIAGRA) 50 MG table Patient self administered medications include: N/A.  SLEEP ARCHITECTURE Patient was studied for 409.5 minutes. The sleep efficiency was 100.0 % and the patient was supine for 92.5%. The arousal index was 0.0 per hour.  RESPIRATORY PARAMETERS The overall AHI was 53.9 per hour, with a central apnea index of 0 per hour.  The oxygen nadir was 73% during sleep. Time spent < 89% was 21.5 minutes.  CARDIAC DATA Mean heart rate during sleep was 72.3 bpm.  IMPRESSIONS - Severe obstructive sleep apnea occurred during this study (AHI 53.9/h). Events were worse with supine sleep (AHI 56.2/h) vs non-supine sleep (AHI 25.5). - Severe oxygen desaturation to a nadir of 73%. - Patient snored 2.2% during the sleep.  DIAGNOSIS - Obstructive Sleep Apnea (G47.33) - Nocturnal Hypoxemia (G47.36)  RECOMMENDATIONS - In this symptomatic  patient with significant cardiovascular co-morbidities with morbid obesity and severe OSA with severe oxygen desaturation  recommend an in-lab CPAP/BiPAP titration study. - Effort should be made to optimize nasal and oropharyngeal patency. - Positional therapy avoiding supine position during sleep. - Avoid alcohol, sedatives and other CNS depressants that may worsen sleep apnea and disrupt normal sleep architecture. - Sleep hygiene should be reviewed to assess factors that may improve sleep quality. - Weight management (BMI 46) and regular exercise should be initiated or continued. - Recommend a download and sleep clinic evaluation after one month of initiating therapy.   [Electronically signed] 12/10/2020 09:37 AM  Nicki Guadalajara MD, [facility], ABSM Diplomate, American Board of Sleep Medicine   NPI: [id number] [facility] PH: [phone]   FX: [phone] ACCREDITED BY THE AMERICAN ACADEMY OF SLEEP MEDICINE

## 2020-12-13 ENCOUNTER — Other Ambulatory Visit: Payer: Self-pay | Admitting: Cardiovascular Disease

## 2020-12-13 ENCOUNTER — Telehealth: Payer: Self-pay | Admitting: *Deleted

## 2020-12-13 DIAGNOSIS — G4733 Obstructive sleep apnea (adult) (pediatric): Secondary | ICD-10-CM

## 2020-12-13 DIAGNOSIS — G4736 Sleep related hypoventilation in conditions classified elsewhere: Secondary | ICD-10-CM

## 2020-12-13 NOTE — Telephone Encounter (Signed)
-----   Message from Lennette Bihari, MD sent at 12/10/2020  9:47 AM EDT ----- Burna Mortimer, please notify patient of the result.  Try to schedule for an in lab CPAP titration study.

## 2020-12-13 NOTE — Telephone Encounter (Signed)
Left message to return a call to discuss his sleep study results and recommendations. °

## 2020-12-16 ENCOUNTER — Telehealth: Payer: Self-pay | Admitting: *Deleted

## 2020-12-16 NOTE — Telephone Encounter (Signed)
Patient returned a call to me and was given his HST results and recommendations. He agrees to proceed with having CPAP/BIPAP titration.

## 2020-12-18 ENCOUNTER — Telehealth: Payer: Self-pay | Admitting: *Deleted

## 2020-12-18 NOTE — Telephone Encounter (Signed)
-----   Message from Gaynelle Cage, CMA sent at 12/13/2020  1:38 PM EDT ----- CPAP titration

## 2020-12-18 NOTE — Telephone Encounter (Signed)
Prior Authorization for CPAP titration sent to [facility] via web portal. Tracking Number [id number].   Prior Authorization for CPAP titration sent to [facility] via web portal no PA is required.

## 2020-12-23 NOTE — Progress Notes (Deleted)
Cardiology Office Note:    Date:  12/23/2020   ID:  Brandon Henson, DOB 1965/11/03, MRN [id number]  PCP:  Patient, No Pcp Per (Inactive)  Cardiologist:  None  Electrophysiologist:  None   Referring MD: No ref. provider found   No chief complaint on file.   History of Present Illness:    Brandon Henson is a 55 y.o. male with a hx of diabetes, hyperlipidemia, hypertension, who presents for follow-up.  He was referred by Dr. Docia Chuck for evaluation of chest pain, initially seen on 09/20/2020.  He was seen in the ED with chest pain on 07/31/2020.  EKG unremarkable, troponins negative.  At initial clinic visit, Lexiscan Myoview and calcium score were ordered, but neither has been done.  Since last clinic visit,  Today, he reports his hypertension is usually stable, and he found out about his diabetes within the past year. In 2004 he had a heart catheterization which found no abnormalities. Since his visit to the ED he has not had any more episodes of chest pain. Prior to the ED visit, he had one episode of left-sided chest pain with a duration of hours (all day). He reports feeling chest tightness at that time and he did not notice any triggers for worsening symptoms. He has been told that he snores and he does get fatigued during the day. He does not have a past sleep study. He does not participate in formal exercise. 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 During work he denies any exertional shortness of breath. He has minor LE edema. He was never a smoker and does not drink alcohol. In his family there is a history of hypertension, but no known history of cardiac disease. He denies any palpitations, syncope, lightheadedness, orthopnea or PND.   Past Medical History:  Diagnosis Date   Coronary artery disease    Hypertension     No past surgical history on file.  Current Medications: No outpatient medications have been marked as  taking for the 12/25/20 encounter (Appointment) with Little Ishikawa, MD.     Allergies:   Patient has no known allergies.   Social History   Socioeconomic History   Marital status: Single    Spouse name: Not on file   Number of children: Not on file   Years of education: Not on file   Highest education level: Not on file  Occupational History   Not on file  Tobacco Use   Smoking status: Never   Smokeless tobacco: Never  Vaping Use   Vaping Use: Never used  Substance and Sexual Activity   Alcohol use: Never   Drug use: Never   Sexual activity: Yes    Birth control/protection: None  Other Topics Concern   Not on file  Social History Narrative   Not on file   Social Determinants of Health   Financial Resource Strain: Not on file  Food Insecurity: Not on file  Transportation Needs: Not on file  Physical Activity: Not on file  Stress: Not on file  Social Connections: Not on file     Family History: The patient's family history includes Healthy in his father and mother.  ROS:   Please see the history of present illness.    (+) Left chest pain (+) Chest tightness (+) Snores (+) Fatigue (+) LE edema All other systems reviewed and are negative.  EKGs/Labs/Other Studies Reviewed:    The following studies  were reviewed today:   EKG:   09/20/2020: NSR, rate 86, LAD, No ST abnormalities  Recent Labs: 07/31/2020: ALT 39; BUN 15; Creatinine, Ser 0.95; Hemoglobin 14.1; Platelets 281; Potassium 4.0; Sodium 137  Recent Lipid Panel No results found for: CHOL, TRIG, HDL, CHOLHDL, VLDL, LDLCALC, LDLDIRECT  Physical Exam:    VS:  There were no vitals taken for this visit.    Wt Readings from Last 3 Encounters:  09/20/20 (!) 360 lb (163.3 kg)  07/11/19 (!) 365 lb (165.6 kg)  03/17/18 (!) 365 lb (165.6 kg)     GEN: Well nourished, well developed in no acute distress HEENT: Normal NECK: No JVD; No carotid bruits LYMPHATICS: No lymphadenopathy CARDIAC: RRR,  no murmurs, rubs, gallops RESPIRATORY:  Clear to auscultation without rales, wheezing or rhonchi  ABDOMEN: Soft, non-tender, non-distended MUSCULOSKELETAL:  No edema; No deformity  SKIN: Warm and dry NEUROLOGIC:  Alert and oriented x 3 PSYCHIATRIC:  Normal affect   ASSESSMENT:    No diagnosis found.  PLAN:    Chest pain: Atypical in description but does have CAD risk factors (age, hypertension, hyperlipidemia, T2DM).  Overall would classify as intermediate risk for obstructive CAD and further evaluation recommended -Lexiscan Myoview to evaluate for ischemia  Hypertension: On amlodipine 10 mg daily.  Appears controlled  Hyperlipidemia: On atorvastatin 10 mg daily.  LDL 77 on 09/17/2020.  Will check calcium score to guide how aggressive to be lowering cholesterol.  T2DM: A1c 7.1% on 09/17/2020.  On metformin  OSA: sleep study 11/22/20 showed severe OSA, starting CPAP  RTC in ***       Medication Adjustments/Labs and Tests Ordered: Current medicines are reviewed at length with the patient today.  Concerns regarding medicines are outlined above.  No orders of the defined types were placed in this encounter.  No orders of the defined types were placed in this encounter.   There are no Patient Instructions on file for this visit.    Signed, Little Ishikawa, MD  12/23/2020 4:18 PM    [facility]

## 2020-12-24 NOTE — Telephone Encounter (Signed)
Received PA approval for CPAP titration. Auth # [id number]. Dates of service was out of range with appointment. Authorization change request form submitted for DOS change.

## 2020-12-25 ENCOUNTER — Ambulatory Visit: Payer: 59 | Admitting: Cardiology

## 2021-01-01 ENCOUNTER — Inpatient Hospital Stay: Admission: RE | Admit: 2021-01-01 | Payer: Self-pay | Source: Ambulatory Visit

## 2021-01-02 ENCOUNTER — Other Ambulatory Visit: Payer: Self-pay

## 2021-01-02 ENCOUNTER — Ambulatory Visit
Admission: RE | Admit: 2021-01-02 | Discharge: 2021-01-02 | Disposition: A | Discharge status: Home / Self Care | Payer: Self-pay | Source: Ambulatory Visit | Attending: Cardiology | Admitting: Cardiology

## 2021-02-14 ENCOUNTER — Other Ambulatory Visit: Payer: Self-pay | Admitting: Cardiology

## 2021-02-14 DIAGNOSIS — E785 Hyperlipidemia, unspecified: Secondary | ICD-10-CM

## 2021-02-18 ENCOUNTER — Inpatient Hospital Stay: Admission: RE | Admit: 2021-02-18 | Payer: Self-pay | Source: Ambulatory Visit

## 2021-02-19 ENCOUNTER — Encounter (HOSPITAL_BASED_OUTPATIENT_CLINIC_OR_DEPARTMENT_OTHER): Payer: 59 | Admitting: Cardiovascular Disease

## 2021-03-21 ENCOUNTER — Other Ambulatory Visit: Payer: Self-pay

## 2021-03-21 ENCOUNTER — Ambulatory Visit (HOSPITAL_BASED_OUTPATIENT_CLINIC_OR_DEPARTMENT_OTHER): Payer: 59 | Attending: Cardiovascular Disease | Admitting: Cardiovascular Disease

## 2021-03-21 DIAGNOSIS — G4733 Obstructive sleep apnea (adult) (pediatric): Secondary | ICD-10-CM

## 2021-03-21 DIAGNOSIS — G4736 Sleep related hypoventilation in conditions classified elsewhere: Secondary | ICD-10-CM

## 2021-04-13 ENCOUNTER — Encounter (HOSPITAL_BASED_OUTPATIENT_CLINIC_OR_DEPARTMENT_OTHER): Payer: Self-pay | Admitting: Cardiovascular Disease

## 2021-04-13 NOTE — Procedures (Signed)
Patient Name: Brandon Henson, Brandon Henson Date: 03/21/2021 Gender: Male D.O.B: 1966/03/03 Age (years): 55 Referring Provider: Epifanio Lesches Height (inches): 73 Interpreting Physician: Nicki Guadalajara MD, ABSM Weight (lbs): 350 RPSGT: Lowry Ram BMI: 46 MRN: [id number] Neck Size: 18.00  CLINICAL INFORMATION The patient is referred for a CPAP titration to treat sleep apnea.  Date of HST:  11/22/2020:  AHI 53.9/h; supine AHI 56.2/h; O2 nadir 73%.  SLEEP STUDY TECHNIQUE As per the AASM Manual for the Scoring of Sleep and Associated Events v2.3 (April 2016) with a hypopnea requiring 4% desaturations.  The channels recorded and monitored were frontal, central and occipital EEG, electrooculogram (EOG), submentalis EMG (chin), nasal and oral airflow, thoracic and abdominal wall motion, anterior tibialis EMG, snore microphone, electrocardiogram, and pulse oximetry. Continuous positive airway pressure (CPAP) was initiated at the beginning of the study and titrated to treat sleep-disordered breathing.  MEDICATIONS acetaminophen (TYLENOL) 500 MG tablet amLODipine (NORVASC) 10 MG tablet aspirin 81 MG chewable tablet atorvastatin (LIPITOR) 10 MG tablet metFORMIN (GLUCOPHAGE) 500 MG tablet omeprazole (PRILOSEC) 20 MG capsule sildenafil (VIAGRA) 50 MG tablet  Medications self-administered by patient taken the night of the study : N/A  TECHNICIAN COMMENTS Comments added by technician: Patient had difficulty initiating sleep. Patient was restless all through the night. Comments added by scorer: N/A  RESPIRATORY PARAMETERS Optimal PAP Pressure (cm): 14 AHI at Optimal Pressure (/hr): 0.7 Overall Minimal O2 (%): 82.0 Supine % at Optimal Pressure (%): 100 Minimal O2 at Optimal Pressure (%): 92.0   SLEEP ARCHITECTURE The study was initiated at 10:55:20 PM and ended at 5:09:15 AM.  Sleep onset time was 1.6 minutes and the sleep efficiency was 81.4%%. The total sleep time was 304.5  minutes.  The patient spent 9.2%% of the night in stage N1 sleep, 49.9%% in stage N2 sleep, 0.0%% in stage N3 and 40.9% in REM.Stage REM latency was 62.0 minutes  Wake after sleep onset was 67.8. Alpha intrusion was absent. Supine sleep was 100.00%.  CARDIAC DATA The 2 lead EKG demonstrated sinus rhythm. The mean heart rate was 61.8 beats per minute. Other EKG findings include: None.  LEG MOVEMENT DATA The total Periodic Limb Movements of Sleep (PLMS) were 0. The PLMS index was 0.0. A PLMS index of <15 is considered normal in adults.  IMPRESSIONS - CPAP was initiated at 5 cm and was titrated to optimal PAP pressure at 14 cm of water (AHI 0.7/h, RDI 0.7/h, O2 nadir 92%). - Central sleep apnea was not noted during this titration (CAI = 0.2/h). - Moderate oxygen desaturations to a nadir of 82% at 8 cm of water. - The patient snored with moderate snoring volume during this titration study. Snoring was absent at 14 cm.  - No cardiac abnormalities were observed during this study. - Clinically significant periodic limb movements were not noted during this study. Arousals associated with PLMs were rare.  DIAGNOSIS - Obstructive Sleep Apnea (G47.33)  RECOMMENDATIONS - Recommend an initial trial of CPAP therapy with EPR of 3 at 14 cm H2O with heated humidification.  A Large size Resmed Full Face Mask AirFit F20 mask was used for the titration. - Effort should be made to optimize nasal and oropharyngeal patency. - Avoid alcohol, sedatives and other CNS depressants that may worsen sleep apnea and disrupt normal sleep architecture. - Sleep hygiene should be reviewed to assess factors that may improve sleep quality. - Weight management and regular exercise should be initiated or continued. - Recommend a down;load in 30  days and sleep clinic evaluation after 4 weeks of therapy.   [Electronically signed] 04/13/2021 05:31 PM  Nicki Guadalajara MD, [facility], ABSM Diplomate, American Board of Sleep  Medicine   NPI: [id number] [facility] PH: [phone]   FX: [phone] ACCREDITED BY THE AMERICAN ACADEMY OF SLEEP MEDICINE

## 2021-04-16 ENCOUNTER — Telehealth: Payer: Self-pay | Admitting: *Deleted

## 2021-04-16 NOTE — Telephone Encounter (Signed)
Patient notified CPAP titration has been completed and order given for CPAP machine has been sent to [facility].

## 2021-04-16 NOTE — Telephone Encounter (Signed)
-----   Message from Lennette Bihari, MD sent at 04/13/2021  5:36 PM EST ----- Burna Mortimer please notify the patient the results of the CPAP and set up with DME company for CPAP initiation.

## 2021-07-30 ENCOUNTER — Ambulatory Visit: Payer: BC Managed Care – PPO | Admitting: Cardiovascular Disease

## 2021-08-04 ENCOUNTER — Encounter: Payer: Self-pay | Admitting: Cardiovascular Disease

## 2022-03-16 ENCOUNTER — Emergency Department (HOSPITAL_COMMUNITY)
Admission: EM | Admit: 2022-03-16 | Discharge: 2022-03-16 | Disposition: A | Discharge status: Home / Self Care | Payer: BC Managed Care – PPO | Attending: Medical | Admitting: Medical

## 2022-03-16 ENCOUNTER — Other Ambulatory Visit: Payer: Self-pay

## 2022-03-16 DIAGNOSIS — M109 Gout, unspecified: Secondary | ICD-10-CM | POA: Diagnosis not present

## 2022-03-16 DIAGNOSIS — R202 Paresthesia of skin: Secondary | ICD-10-CM | POA: Diagnosis present

## 2022-03-16 DIAGNOSIS — Z7982 Long term (current) use of aspirin: Secondary | ICD-10-CM | POA: Diagnosis not present

## 2022-03-16 MED ORDER — PREDNISONE 10 MG PO TABS: ORAL_TABLET | ORAL | 0 refills | Status: AC

## 2022-03-16 MED ORDER — KETOROLAC TROMETHAMINE 30 MG/ML IJ SOLN
30.0000 mg | Freq: Once | INTRAMUSCULAR | Status: AC
  Administered 2022-03-16: 30 mg via INTRAMUSCULAR
  Filled 2022-03-16: qty 1

## 2022-03-16 MED ORDER — KETOROLAC TROMETHAMINE 30 MG/ML IJ SOLN: 30.0000 mg | Freq: Once | INTRAMUSCULAR | Status: DC

## 2022-03-16 NOTE — ED Provider Notes (Signed)
[name] EMERGENCY DEPARTMENT Provider Note   CSN: [id number] Arrival date & time: 03/16/22  1339     History  Chief Complaint  Patient presents with   Tingling    Brandon Henson is a 56 y.o. male, history of gout, who presents to the ED secondary to right hand swelling for the last day, states saw his primary care doctor yesterday, was put on colchicine, but states that he has not had any relief.  States that he is severe right hand pain, and it is exactly how his past gout flares have been.  He has swelling of the hand.  No recent trauma.  No history of PEs DVTs.       Home Medications Prior to Admission medications   Medication Sig Start Date End Date Taking? Authorizing Provider  predniSONE (DELTASONE) 10 MG tablet Take 4 tablets (40 mg total) by mouth daily for 5 days, THEN 3 tablets (30 mg total) daily for 3 days, THEN 2 tablets (20 mg total) daily for 3 days, THEN 1 tablet (10 mg total) daily for 2 days. 03/16/22 03/29/22 Yes Krystine Pabst L, PA  acetaminophen (TYLENOL) 500 MG tablet Take 2 tablets (1,000 mg total) by mouth every 6 (six) hours as needed. 07/31/20   Arby Barrette, MD  amLODipine (NORVASC) 10 MG tablet Take 10 mg by mouth daily. 04/29/20   [provider]  aspirin 81 MG chewable tablet Chew 1 tablet (81 mg total) by mouth daily. 07/31/20   Arby Barrette, MD  atorvastatin (LIPITOR) 10 MG tablet Take 10 mg by mouth daily. 06/21/20   [provider]  metFORMIN (GLUCOPHAGE) 500 MG tablet Take 500 mg by mouth 2 (two) times daily. 06/04/20   [provider]  omeprazole (PRILOSEC) 20 MG capsule Take 1 capsule (20 mg total) by mouth daily. 07/31/20   Arby Barrette, MD  sildenafil (VIAGRA) 50 MG tablet Take 50 mg by mouth as needed for erectile dysfunction. 07/18/20   [provider]      Allergies    Patient has no known allergies.    Review of Systems   Review of Systems  Musculoskeletal:        +R hand  swelling and pain  Skin:  Negative for color change and pallor.    Physical Exam Updated Vital Signs BP (!) 157/105   Pulse 99   Temp 99.3 F (37.4 C)   Resp 16   Ht 6\' 1"  (1.854 m)   Wt (!) 155.1 kg   SpO2 99%   BMI 45.12 kg/m  Physical Exam Vitals and nursing note reviewed.  Constitutional:      General: He is not in acute distress.    Appearance: He is well-developed.  HENT:     Head: Normocephalic and atraumatic.  Eyes:     General:        Right eye: No discharge.        Left eye: No discharge.     Conjunctiva/sclera: Conjunctivae normal.  Cardiovascular:     Pulses: Normal pulses.  Pulmonary:     Effort: No respiratory distress.  Musculoskeletal:     Comments: Right hand: TTP of MCPs 3-5. Radial pulses present. Grip strength intact. Able to flex, extend, ulnar and radial deviate wrist. Two point discrimination intact. Normal thumb opposition. Intact ROM for all MCPs, PIPs, and DIPs.  No snuffbox ttp. No sensory deficits. Capillary refill <2sec. Diffuse swelling and warmth.    Skin:    Capillary Refill:  Capillary refill takes less than 2 seconds.     Comments: +hot  Neurological:     Mental Status: He is alert.     Sensory: Sensory deficit present.     Motor: No weakness.     Comments: Clear speech.   Psychiatric:        Behavior: Behavior normal.        Thought Content: Thought content normal.     ED Results / Procedures / Treatments   Labs (all labs ordered are listed, but only abnormal results are displayed) Labs Reviewed - No data to display  EKG None  Radiology No results found.  Procedures Procedures   Medications Ordered in ED Medications  ketorolac (TORADOL) 30 MG/ML injection 30 mg (30 mg Intramuscular Given 03/16/22 1501)    ED Course/ Medical Decision Making/ A&P                           Medical Decision Making Discussed with patient, he has diffuse swelling of his hand, states that since is gotten more swollen he has had some  tingling of his digits 3 through 5.  Has a history of gout, states this is presenting the same way.  States that typically he has gout in his elbows, or hands.  Is on colchicine, however states that it has not been providing him any relief.  We will treat for an acute gout flare with prednisone, he is on metformin, and we discussed that this may elevate his sugars temporarily.  We will give him relief of his pain.  Discussed need for follow-up with primary care doctor, and risk of compartment syndrome.  He has good pulses, and has no induration of his hand, and likely is secondary to the swelling causing some pressure on the nerves, he voiced understanding, on icing the hand, elevating it, and return precautions were emphasized. He declined X-ray.    Final Clinical Impression(s) / ED Diagnoses Final diagnoses:  Acute gout of right hand, unspecified cause    Rx / DC Orders ED Discharge Orders          Ordered    predniSONE (DELTASONE) 10 MG tablet  Daily        03/16/22 1502              Wilhelmenia Addis, [place], [place] 03/16/22 1507    Carmin Muskrat, MD 03/16/22 [phone]

## 2022-03-16 NOTE — ED Provider Triage Note (Signed)
Emergency Medicine Provider Triage Evaluation Note  Brandon Henson is a 56 y.o. male, history of gout, who presents to the ED secondary to right hand swelling for the last day, states saw his primary care doctor yesterday, was put on colchicine, but states that he has not had any relief.  States that he is severe right hand pain, and it is exactly how his past gout flares have been.  He has swelling of the hand.  No recent trauma.  No history of PEs DVTs.     Review of Systems  Positive: Hand swelling, tingling Negative: coolness  Physical Exam  BP (!) 157/105   Pulse 99   Temp 99.3 F (37.4 C)   Resp 16   Ht 6\' 1"  (1.854 m)   Wt (!) 155.1 kg   SpO2 99%   BMI 45.12 kg/m  Gen:   Awake, no distress   Resp:  Normal effort  MSK:   Moves extremities without difficulty  Other:  +R hand swelling, ttp diffuse  Medical Decision Making  Medically screening exam initiated at 2:48 PM.  Appropriate orders placed.  Brandon Henson was informed that the remainder of the evaluation will be completed by another provider, this initial triage assessment does not replace that evaluation, and the importance of remaining in the ED until their evaluation is complete.    Osvaldo Shipper, [place] 03/16/22 1510

## 2022-03-16 NOTE — ED Triage Notes (Signed)
Pt with hx of gout reports intermittent tingling in R fingers since yesterday. Reports symptoms started yesterday at 1600. Tingling worse today than yesterday but remains intermittent.

## 2022-10-11 IMAGING — DX DG CHEST 2V
2 series · 2 of 2 positions shown · non-contrast
Comparison: 07/31/2020

CLINICAL DATA: Cough for 4 months

EXAM:
CHEST - 2 VIEW

[dg chest 2 view (1 of 2)]
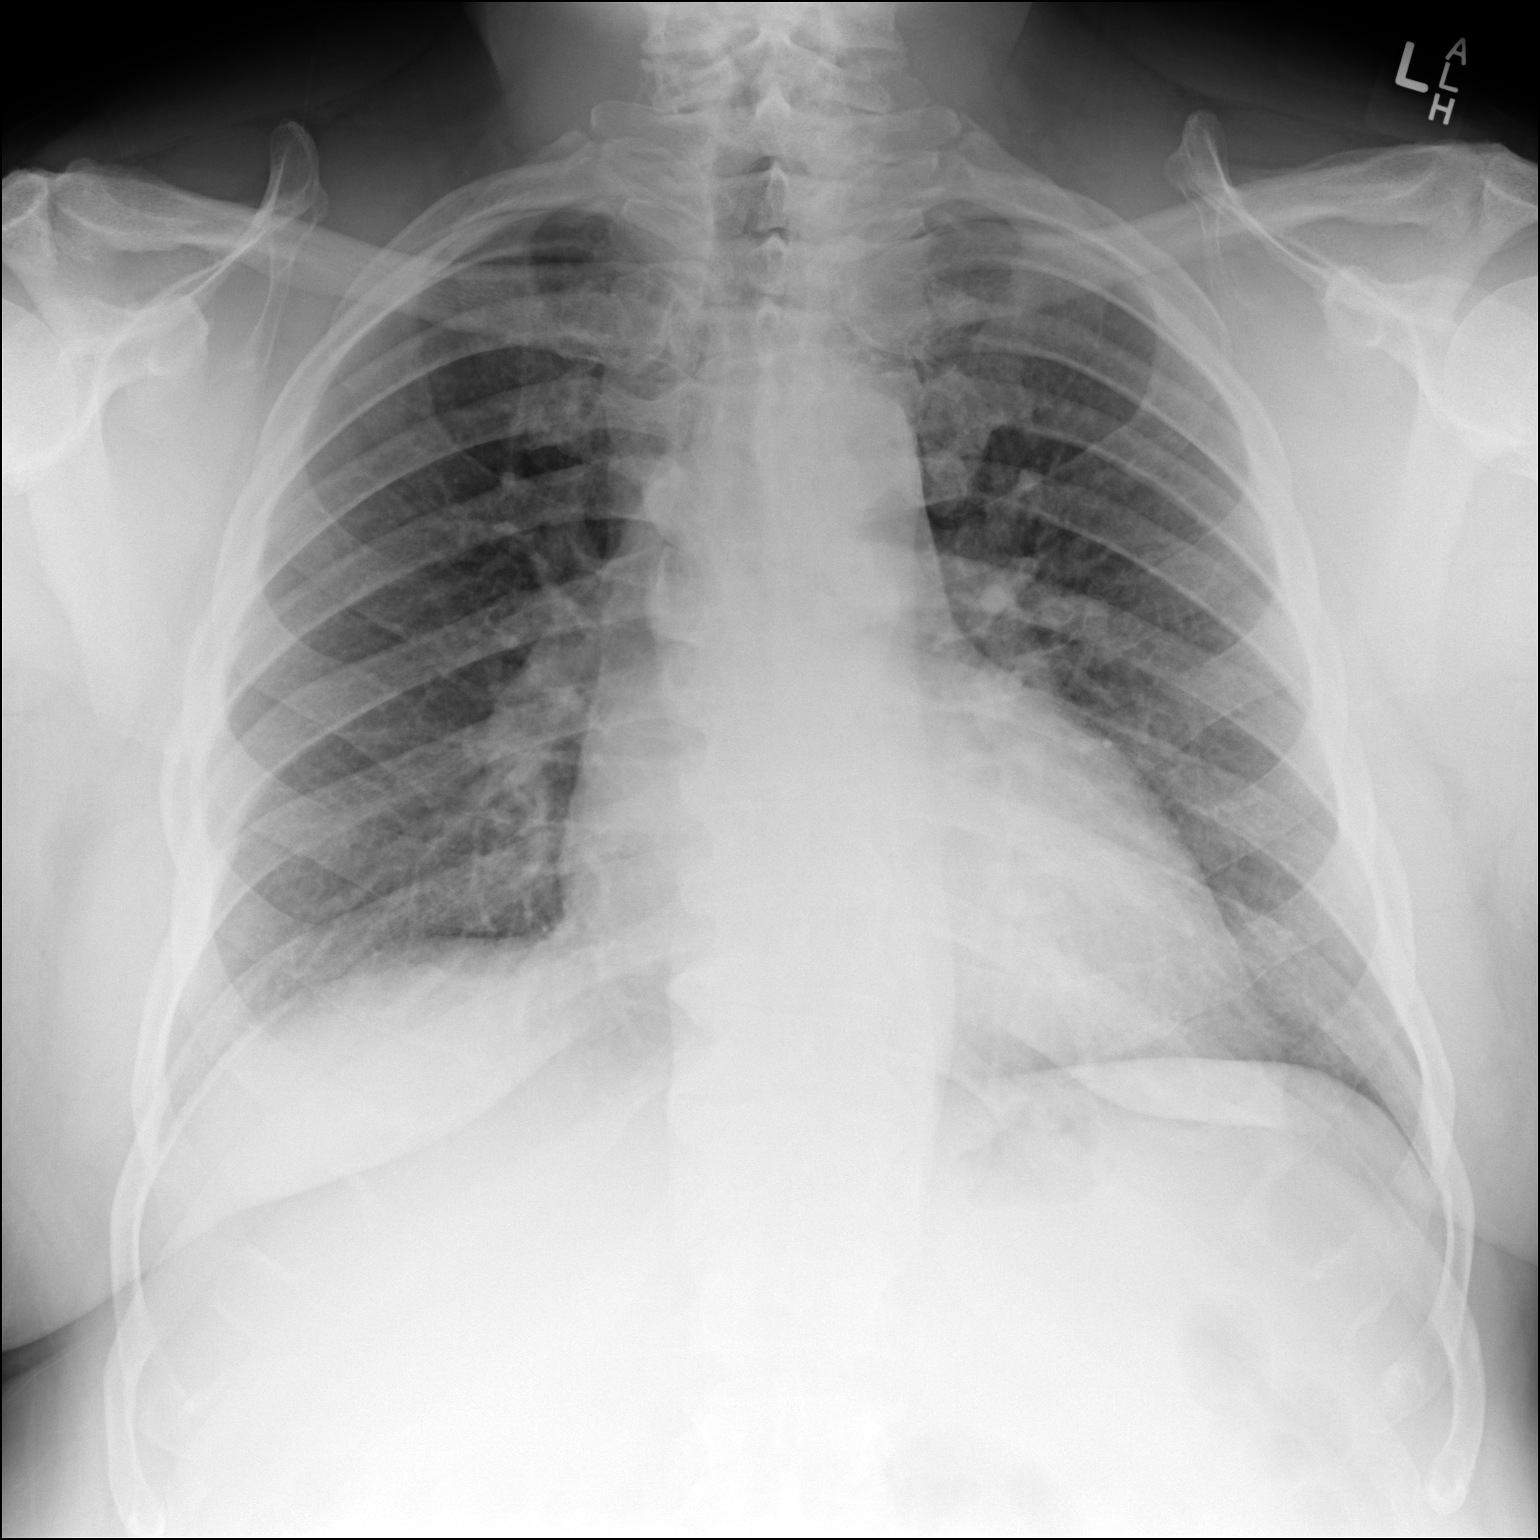

[dg chest 2 view (2 of 2)]
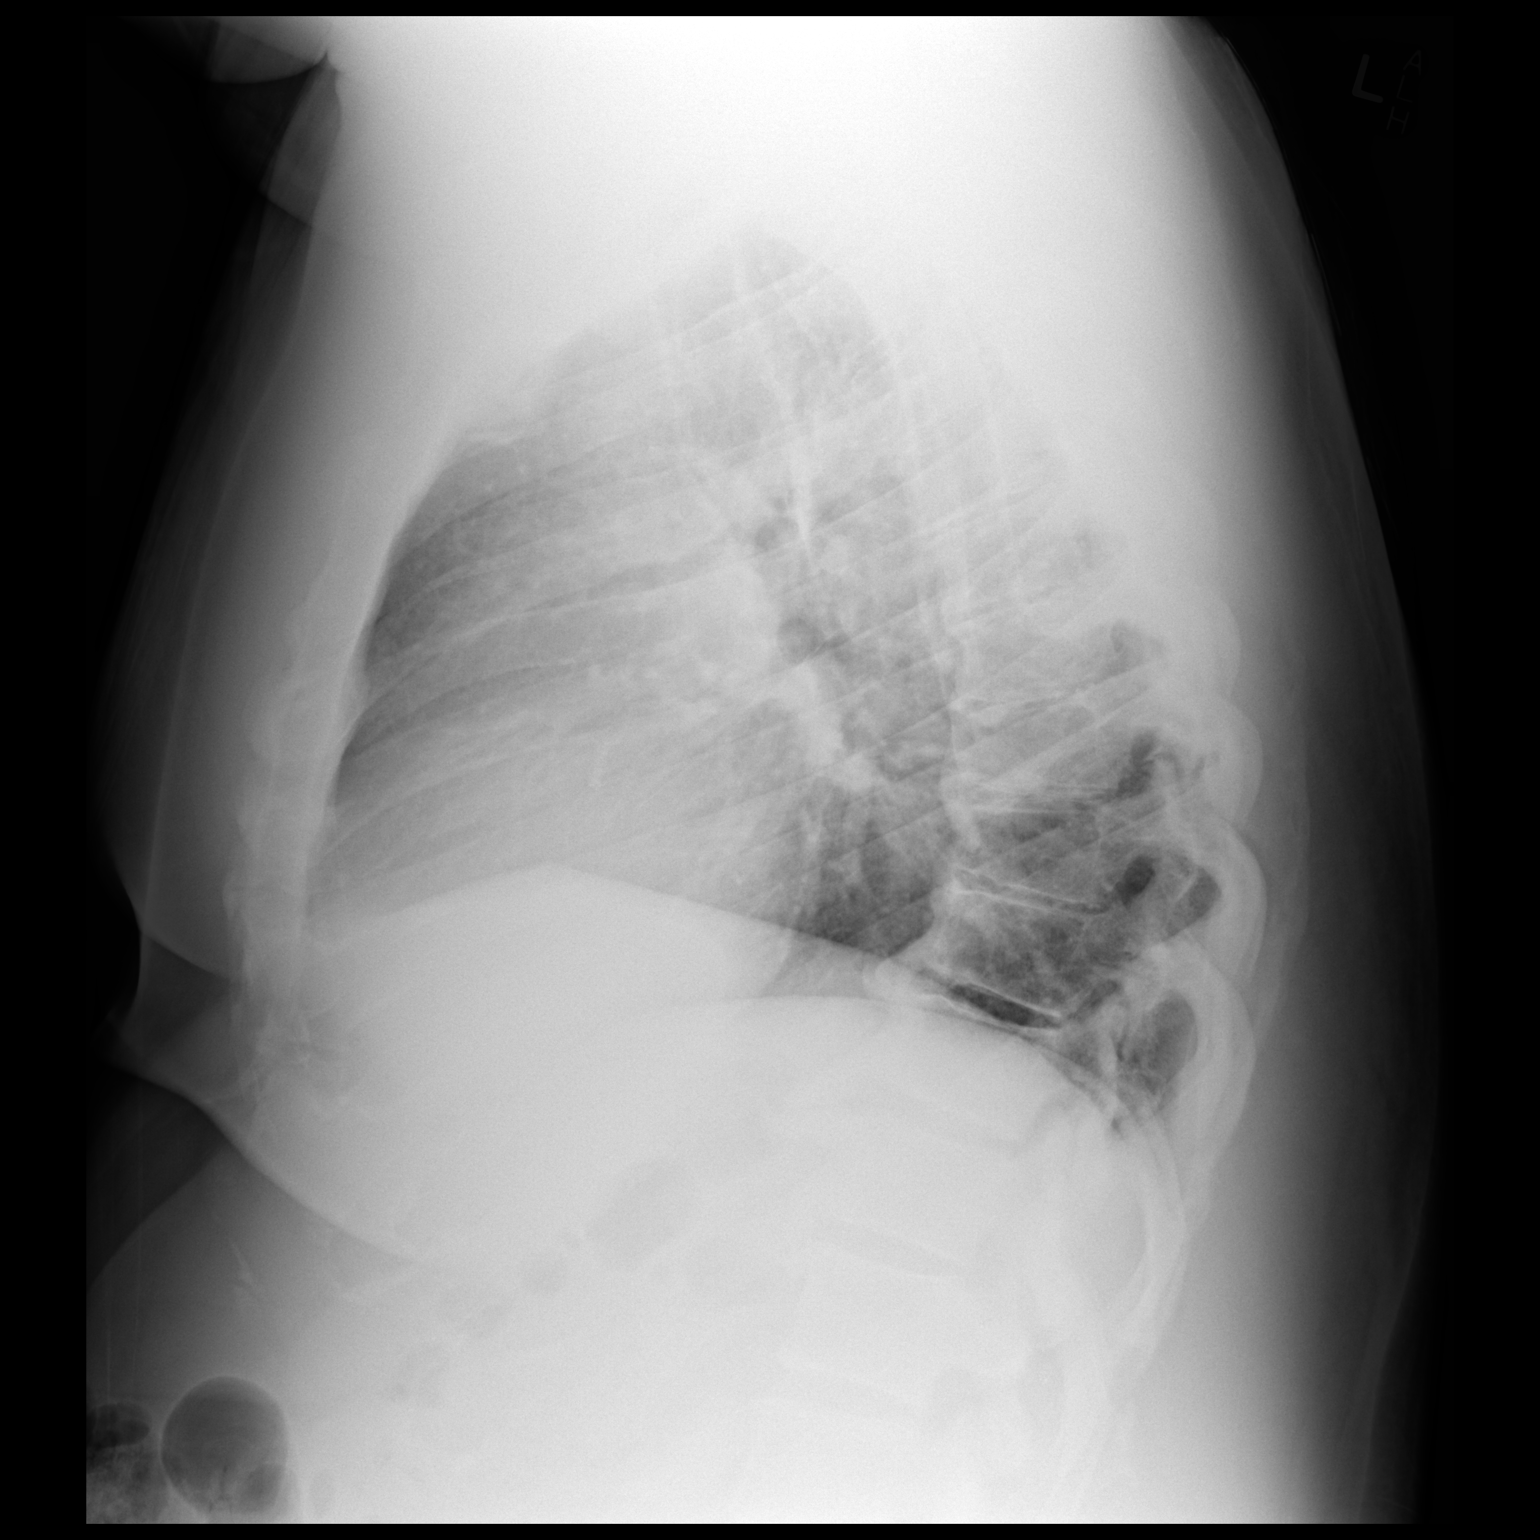

[2 of 2 positions shown; findings below may reference images not displayed]

FINDINGS: The heart size and mediastinal contours are within normal limits.
Both lungs are clear. Degenerative disc disease identified within
the thoracic spine.
IMPRESSION: No active cardiopulmonary disease.

## 2022-12-31 ENCOUNTER — Encounter (HOSPITAL_COMMUNITY): Payer: Self-pay | Admitting: Emergency Medicine

## 2022-12-31 ENCOUNTER — Emergency Department (HOSPITAL_COMMUNITY)
Admission: EM | Admit: 2022-12-31 | Discharge: 2022-12-31 | Disposition: A | Discharge status: Home / Self Care | Payer: BC Managed Care – PPO | Attending: Student | Admitting: Student

## 2022-12-31 ENCOUNTER — Other Ambulatory Visit: Payer: Self-pay

## 2022-12-31 DIAGNOSIS — I2510 Atherosclerotic heart disease of native coronary artery without angina pectoris: Secondary | ICD-10-CM | POA: Diagnosis not present

## 2022-12-31 DIAGNOSIS — Z7982 Long term (current) use of aspirin: Secondary | ICD-10-CM | POA: Insufficient documentation

## 2022-12-31 DIAGNOSIS — R42 Dizziness and giddiness: Secondary | ICD-10-CM | POA: Insufficient documentation

## 2022-12-31 DIAGNOSIS — I10 Essential (primary) hypertension: Secondary | ICD-10-CM | POA: Diagnosis not present

## 2022-12-31 DIAGNOSIS — Z79899 Other long term (current) drug therapy: Secondary | ICD-10-CM | POA: Diagnosis not present

## 2022-12-31 DIAGNOSIS — R519 Headache, unspecified: Secondary | ICD-10-CM | POA: Diagnosis not present

## 2022-12-31 LAB — CBC WITH DIFFERENTIAL/PLATELET
Abs Immature Granulocytes: 0.02 10*3/uL (ref 0.00–0.07)
Basophils Absolute: 0 10*3/uL (ref 0.0–0.1)
Basophils Relative: 1 %
Eosinophils Absolute: 0.1 10*3/uL (ref 0.0–0.5)
Eosinophils Relative: 2 %
HCT: 45.8 % (ref 39.0–52.0)
Hemoglobin: 14.2 g/dL (ref 13.0–17.0)
Immature Granulocytes: 0 %
Lymphocytes Relative: 33 %
Lymphs Abs: 2.2 10*3/uL (ref 0.7–4.0)
MCH: 22.1 pg — ABNORMAL LOW (ref 26.0–34.0)
MCHC: 31 g/dL (ref 30.0–36.0)
MCV: 71.2 fL — ABNORMAL LOW (ref 80.0–100.0)
Monocytes Absolute: 0.9 10*3/uL (ref 0.1–1.0)
Monocytes Relative: 14 %
Neutro Abs: 3.4 10*3/uL (ref 1.7–7.7)
Neutrophils Relative %: 50 %
Platelets: 213 10*3/uL (ref 150–400)
RBC: 6.43 MIL/uL — ABNORMAL HIGH (ref 4.22–5.81)
RDW: 15.1 % (ref 11.5–15.5)
WBC: 6.7 10*3/uL (ref 4.0–10.5)
nRBC: 0 % (ref 0.0–0.2)

## 2022-12-31 LAB — COMPREHENSIVE METABOLIC PANEL
ALT: 27 U/L (ref 0–44)
AST: 24 U/L (ref 15–41)
Albumin: 3.6 g/dL (ref 3.5–5.0)
Alkaline Phosphatase: 42 U/L (ref 38–126)
Anion gap: 10 (ref 5–15)
BUN: 18 mg/dL (ref 6–20)
CO2: 22 mmol/L (ref 22–32)
Calcium: 8.7 mg/dL — ABNORMAL LOW (ref 8.9–10.3)
Chloride: 101 mmol/L (ref 98–111)
Creatinine, Ser: 1.05 mg/dL (ref 0.61–1.24)
GFR, Estimated: >60 mL/min (ref >60)
Glucose, Bld: 127 mg/dL — ABNORMAL HIGH (ref 70–99)
Potassium: 3.6 mmol/L (ref 3.5–5.1)
Sodium: 133 mmol/L — ABNORMAL LOW (ref 135–145)
Total Bilirubin: 0.7 mg/dL (ref 0.3–1.2)
Total Protein: 6.9 g/dL (ref 6.5–8.1)

## 2022-12-31 MED ORDER — DIPHENHYDRAMINE HCL 50 MG/ML IJ SOLN
25.0000 mg | Freq: Once | INTRAMUSCULAR | Status: AC
  Administered 2022-12-31: 25 mg via INTRAVENOUS
  Filled 2022-12-31: qty 1

## 2022-12-31 MED ORDER — LACTATED RINGERS IV BOLUS
1000.0000 mL | Freq: Once | INTRAVENOUS | Status: AC
  Administered 2022-12-31: 1000 mL via INTRAVENOUS

## 2022-12-31 MED ORDER — PROCHLORPERAZINE EDISYLATE 10 MG/2ML IJ SOLN
10.0000 mg | Freq: Once | INTRAMUSCULAR | Status: AC
  Administered 2022-12-31: 10 mg via INTRAVENOUS
  Filled 2022-12-31: qty 2

## 2022-12-31 NOTE — ED Provider Notes (Signed)
[place] EMERGENCY DEPARTMENT AT [facility] Provider Note  CSN: [id number] Arrival date & time: 12/31/22 0012  Chief Complaint(s) Dizziness  HPI Brandon Henson is a 57 y.o. male with PMH CAD, HTN who presents to the emergency department for evaluation of lightheadedness and dizziness.  Patient states that he has felt this before and is usually associated with high salt intake and heavily seasoned foods.  He states that today at lunch she had heavily seasoned chicken and started to feel lightheaded.  He stood up and his lightheadedness worsened.  He was noted to have high blood pressure in the field and was brought to the emergency department for further evaluation.  Here in the emergency room, he states that symptoms are starting to improve but endorses a persistent headache.  Denies numbness, tingling, weakness or other neurological plaints.  Denies chest pain, shortness of breath, fever or other systemic complaints.  Patient has been compliant with his blood pressure medicines.   Past Medical History Past Medical History:  Diagnosis Date   Coronary artery disease    Hypertension    Patient Active Problem List   Diagnosis Date Noted   OSA (obstructive sleep apnea) 03/21/2021   Home Medication(s) Prior to Admission medications   Medication Sig Start Date End Date Taking? Authorizing Provider  allopurinol (ZYLOPRIM) 100 MG tablet Take 100 mg by mouth daily. 12/03/22  Yes [provider]  acetaminophen (TYLENOL) 500 MG tablet Take 2 tablets (1,000 mg total) by mouth every 6 (six) hours as needed. 07/31/20   Arby Barrette, MD  amLODipine (NORVASC) 10 MG tablet Take 10 mg by mouth daily. 04/29/20   [provider]  aspirin 81 MG chewable tablet Chew 1 tablet (81 mg total) by mouth daily. 07/31/20   Arby Barrette, MD  atorvastatin (LIPITOR) 10 MG tablet Take 10 mg by mouth daily. 06/21/20   [provider]  metFORMIN (GLUCOPHAGE) 500 MG tablet Take 500  mg by mouth 2 (two) times daily. 06/04/20   [provider]  omeprazole (PRILOSEC) 20 MG capsule Take 1 capsule (20 mg total) by mouth daily. 07/31/20   Arby Barrette, MD  sildenafil (VIAGRA) 50 MG tablet Take 50 mg by mouth as needed for erectile dysfunction. 07/18/20   [provider]  telmisartan (MICARDIS) 20 MG tablet Take 20 mg by mouth daily.    [provider]                                                                                                                                    Past Surgical History History reviewed. No pertinent surgical history. Family History Family History  Problem Relation Age of Onset   Healthy Mother    Healthy Father     Social History Social History   Tobacco Use   Smoking status: Never   Smokeless tobacco: Never  Vaping Use   Vaping status: Never Used  Substance  Use Topics   Alcohol use: Never   Drug use: Never   Allergies Patient has no known allergies.  Review of Systems Review of Systems  Neurological:  Positive for light-headedness and headaches.    Physical Exam Vital Signs  I have reviewed the triage vital signs BP 110/75   Pulse 69   Temp 98.6 F (37 C) (Oral)   Resp 13   Ht 6\' 1"  (1.854 m)   Wt (!) 155.1 kg   SpO2 100%   BMI 45.12 kg/m   Physical Exam Constitutional:      General: He is not in acute distress.    Appearance: Normal appearance.  HENT:     Head: Normocephalic and atraumatic.     Nose: No congestion or rhinorrhea.  Eyes:     General:        Right eye: No discharge.        Left eye: No discharge.     Extraocular Movements: Extraocular movements intact.     Pupils: Pupils are equal, round, and reactive to light.  Cardiovascular:     Rate and Rhythm: Normal rate and regular rhythm.     Heart sounds: No murmur heard. Pulmonary:     Effort: No respiratory distress.     Breath sounds: No wheezing or rales.  Abdominal:     General: There is no distension.      Tenderness: There is no abdominal tenderness.  Musculoskeletal:        General: Normal range of motion.     Cervical back: Normal range of motion.  Skin:    General: Skin is warm and dry.  Neurological:     General: No focal deficit present.     Mental Status: He is alert.     Cranial Nerves: No cranial nerve deficit.     Sensory: No sensory deficit.     Motor: No weakness.     ED Results and Treatments Labs (all labs ordered are listed, but only abnormal results are displayed) Labs Reviewed  COMPREHENSIVE METABOLIC PANEL - Abnormal; Notable for the following components:      Result Value   Sodium 133 (*)    Glucose, Bld 127 (*)    Calcium 8.7 (*)    All other components within normal limits  CBC WITH DIFFERENTIAL/PLATELET - Abnormal; Notable for the following components:   RBC 6.43 (*)    MCV 71.2 (*)    MCH 22.1 (*)    All other components within normal limits                                                                                                                          Radiology No results found.  Pertinent labs & imaging results that were available during my care of the patient were reviewed by me and considered in my medical decision making (see MDM for details).  Medications Ordered in ED Medications  prochlorperazine (COMPAZINE) injection 10 mg (10 mg  Intravenous Given 12/31/22 0144)  diphenhydrAMINE (BENADRYL) injection 25 mg (25 mg Intravenous Given 12/31/22 0143)  lactated ringers bolus 1,000 mL (0 mLs Intravenous Stopped 12/31/22 0241)                                                                                                                                     Procedures Procedures  (including critical care time)  Medical Decision Making / ED Course   This patient presents to the ED for concern of lightheadedness, this involves an extensive number of treatment options, and is a complaint that carries with it a high risk of complications and  morbidity.  The differential diagnosis includes orthostatic presyncope, cardiogenic presyncope, dehydration, medication side effect, electrolyte abnormality, dehydration  MDM: Patient seen emerged part for evaluation of lightheadedness and headache.  Physical exam is largely unremarkable with no focal motor or sensory deficits.  No cranial nerve deficits.  Cardiopulmonary exam unremarkable.  ECG nonischemic and with no evidence of dysrhythmia.  Laboratory evaluation with some mild hyponatremia to 133 but is otherwise unremarkable.  Patient fluid resuscitated and received a headache cocktail and on reevaluation symptoms significantly improved.  With normal neurologic exam, I have low suspicion for CVA and advanced imaging deferred.  Orthostatic vital signs obtained with no return of orthostatic lightheadedness.  At this time, patient does not meet inpatient criteria for admission and he is safe for discharge with outpatient follow-up.  Patient given return precautions which he voiced understanding he was discharged   Additional history obtained:  -External records from outside source obtained and reviewed including: Chart review including previous notes, labs, imaging, consultation notes   Lab Tests: -I ordered, reviewed, and interpreted labs.   The pertinent results include:   Labs Reviewed  COMPREHENSIVE METABOLIC PANEL - Abnormal; Notable for the following components:      Result Value   Sodium 133 (*)    Glucose, Bld 127 (*)    Calcium 8.7 (*)    All other components within normal limits  CBC WITH DIFFERENTIAL/PLATELET - Abnormal; Notable for the following components:   RBC 6.43 (*)    MCV 71.2 (*)    MCH 22.1 (*)    All other components within normal limits      EKG   EKG Interpretation Date/Time:  Thursday December 31 2022 00:19:49 EDT Ventricular Rate:  71 PR Interval:  163 QRS Duration:  100 QT Interval:  404 QTC Calculation: 439 R Axis:   -50  Text Interpretation: Sinus  rhythm Left anterior fascicular block Confirmed by Beulah Capobianco ([id number]) on 12/31/2022 12:40:40 AM           Medicines ordered and prescription drug management: Meds ordered this encounter  Medications   prochlorperazine (COMPAZINE) injection 10 mg   diphenhydrAMINE (BENADRYL) injection 25 mg   lactated ringers bolus 1,000 mL    -I have reviewed the patients home medicines and have made adjustments  as needed  Critical interventions none    Cardiac Monitoring: The patient was maintained on a cardiac monitor.  I personally viewed and interpreted the cardiac monitored which showed an underlying rhythm of: NSR  Social Determinants of Health:  Factors impacting patients care include: none   Reevaluation: After the interventions noted above, I reevaluated the patient and found that they have :improved  Co morbidities that complicate the patient evaluation  Past Medical History:  Diagnosis Date   Coronary artery disease    Hypertension       Dispostion: I considered admission for this patient, but at this time he does not meet inpatient criteria for admission he is safe for discharge with outpatient follow-up.     Final Clinical Impression(s) / ED Diagnoses Final diagnoses:  Lightheadedness  Acute nonintractable headache, unspecified headache type     @PCDICTATION @    Glendora Score, MD 12/31/22 [phone]

## 2022-12-31 NOTE — ED Triage Notes (Signed)
Pt BIB [facility] from work c/o dizziness, headache, and hypertension x 1.5 hours ago, reports hx of HTN, denies missing any doses

## 2023-01-14 ENCOUNTER — Other Ambulatory Visit: Payer: Self-pay | Admitting: Urology

## 2023-01-14 DIAGNOSIS — R972 Elevated prostate specific antigen [PSA]: Secondary | ICD-10-CM

## 2023-03-05 ENCOUNTER — Inpatient Hospital Stay: Admission: RE | Admit: 2023-03-05 | Payer: BC Managed Care – PPO | Source: Ambulatory Visit

## 2023-04-20 ENCOUNTER — Other Ambulatory Visit: Payer: Self-pay | Admitting: Urology

## 2023-04-20 DIAGNOSIS — R972 Elevated prostate specific antigen [PSA]: Secondary | ICD-10-CM

## 2023-06-06 ENCOUNTER — Other Ambulatory Visit: Payer: Self-pay

## 2023-06-06 ENCOUNTER — Encounter (HOSPITAL_BASED_OUTPATIENT_CLINIC_OR_DEPARTMENT_OTHER): Payer: Self-pay | Admitting: Emergency Medicine

## 2023-06-06 ENCOUNTER — Emergency Department (HOSPITAL_BASED_OUTPATIENT_CLINIC_OR_DEPARTMENT_OTHER)
Admission: EM | Admit: 2023-06-06 | Discharge: 2023-06-06 | Disposition: A | Discharge status: Home / Self Care | Payer: 59 | Attending: Emergency Medicine | Admitting: Emergency Medicine

## 2023-06-06 DIAGNOSIS — M25531 Pain in right wrist: Secondary | ICD-10-CM | POA: Diagnosis present

## 2023-06-06 DIAGNOSIS — Z7982 Long term (current) use of aspirin: Secondary | ICD-10-CM | POA: Diagnosis not present

## 2023-06-06 DIAGNOSIS — M109 Gout, unspecified: Secondary | ICD-10-CM | POA: Insufficient documentation

## 2023-06-06 HISTORY — DX: Gout, unspecified: M10.9

## 2023-06-06 MED ORDER — PREDNISONE 20 MG PO TABS: ORAL_TABLET | ORAL | 0 refills | Status: DC

## 2023-06-06 NOTE — Discharge Instructions (Signed)
We are putting you on a steroid taper over 2 weeks.  This can cause your glucose/blood sugar to increase.  Follow-up closely with your primary care physician.  Take your other medicines as prescribed by your doctor.  You may take ibuprofen and/or Tylenol to help with pain.  If you develop fever, new or worsening symptoms, or any other new/concerning symptoms and return to the ER or call 911.

## 2023-06-06 NOTE — ED Triage Notes (Signed)
Pt with RT wrist pain r/t gout x 1 wk

## 2023-06-06 NOTE — ED Provider Notes (Signed)
[place] EMERGENCY DEPARTMENT AT [facility] Provider Note   CSN: [id number] Arrival date & time: 06/06/23  1322     History  Chief Complaint  Patient presents with   Wrist Pain    Brandon Henson is a 58 y.o. male.  HPI 58 year old male with a history of gout presents with right wrist pain and swelling.  This been ongoing for about a week.  Seems to not be getting better and getting a little worse.  Saw his doctor and several days ago was given a steroid shot but no prescription of steroids.  He has not had any fever or numbness.  He is on allopurinol and states he thinks his dose was increased and he was also on colchicine though he stopped this because it did not seem to help.  He has been taking some Tylenol for pain.  Home Medications Prior to Admission medications   Medication Sig Start Date End Date Taking? Authorizing Provider  acetaminophen (TYLENOL) 500 MG tablet Take 2 tablets (1,000 mg total) by mouth every 6 (six) hours as needed. 07/31/20   Arby Barrette, MD  allopurinol (ZYLOPRIM) 100 MG tablet Take 100 mg by mouth daily. 12/03/22   [provider]  amLODipine (NORVASC) 10 MG tablet Take 10 mg by mouth daily. 04/29/20   [provider]  aspirin 81 MG chewable tablet Chew 1 tablet (81 mg total) by mouth daily. 07/31/20   Arby Barrette, MD  atorvastatin (LIPITOR) 10 MG tablet Take 10 mg by mouth daily. 06/21/20   [provider]  metFORMIN (GLUCOPHAGE) 500 MG tablet Take 500 mg by mouth 2 (two) times daily. 06/04/20   [provider]  omeprazole (PRILOSEC) 20 MG capsule Take 1 capsule (20 mg total) by mouth daily. 07/31/20   Arby Barrette, MD  sildenafil (VIAGRA) 50 MG tablet Take 50 mg by mouth as needed for erectile dysfunction. 07/18/20   [provider]  telmisartan (MICARDIS) 20 MG tablet Take 20 mg by mouth daily.    [provider]      Allergies    Patient has no known allergies.    Review of  Systems   Review of Systems  Constitutional:  Negative for fever.  Musculoskeletal:  Positive for arthralgias and joint swelling.  Skin:  Negative for color change.    Physical Exam Updated Vital Signs BP (!) 150/92 (BP Location: Right Arm)   Pulse 77   Temp 98 F (36.7 C)   Resp 15   Ht 6\' 1"  (1.854 m)   Wt (!) 160.6 kg   SpO2 96%   BMI 46.70 kg/m  Physical Exam Vitals and nursing note reviewed.  Constitutional:      Appearance: He is well-developed.  HENT:     Head: Normocephalic and atraumatic.  Cardiovascular:     Rate and Rhythm: Normal rate and regular rhythm.     Pulses:          Radial pulses are 2+ on the right side.  Pulmonary:     Effort: Pulmonary effort is normal.  Musculoskeletal:     Right wrist: Swelling and tenderness present.     Right hand: Swelling and tenderness present. No deformity. Normal range of motion.     Comments: There is some minimal decreased range of motion of his right wrist.  Some swelling, mostly to his proximal hand.  No erythema.  Mild and diffusely tender and mild warmth  Skin:    General: Skin is warm and  dry.  Neurological:     Mental Status: He is alert.     ED Results / Procedures / Treatments   Labs (all labs ordered are listed, but only abnormal results are displayed) Labs Reviewed - No data to display  EKG None  Radiology No results found.  Procedures Procedures    Medications Ordered in ED Medications - No data to display  ED Course/ Medical Decision Making/ A&P                                 Medical Decision Making Amount and/or Complexity of Data Reviewed External Data Reviewed: notes.  Risk Prescription drug management.   Patient tells me this is how his gout has always presented.  Seems like based on chart review and discussing with patient, steroids seem to improve it and so we will give a 2-week taper of steroids.  Have also recommended Tylenol and NSAIDs for pain.  He does not have any fever  and my suspicion of a joint infection such as septic arthritis is low.  I do not think antibiotics or imaging is needed.  No trauma.  Will prescribe steroids and have him follow-up with PCP.  Given return precautions.        Final Clinical Impression(s) / ED Diagnoses Final diagnoses:  None    Rx / DC Orders ED Discharge Orders     None         Pricilla Loveless, MD 06/06/23 1555

## 2023-06-13 ENCOUNTER — Ambulatory Visit
Admission: RE | Admit: 2023-06-13 | Discharge: 2023-06-13 | Disposition: A | Discharge status: Home / Self Care | Payer: BC Managed Care – PPO | Source: Ambulatory Visit | Attending: Urology | Admitting: Urology

## 2023-06-13 DIAGNOSIS — R972 Elevated prostate specific antigen [PSA]: Secondary | ICD-10-CM

## 2023-06-13 MED ORDER — GADOPICLENOL 0.5 MMOL/ML IV SOLN
10.0000 mL | Freq: Once | INTRAVENOUS | Status: AC | PRN
  Administered 2023-06-13: 10 mL via INTRAVENOUS

## 2023-09-10 ENCOUNTER — Other Ambulatory Visit (HOSPITAL_COMMUNITY): Payer: Self-pay | Admitting: Urology

## 2023-09-10 DIAGNOSIS — C61 Malignant neoplasm of prostate: Secondary | ICD-10-CM

## 2023-09-29 ENCOUNTER — Telehealth: Payer: Self-pay | Admitting: Radiation Oncology

## 2023-09-29 NOTE — Telephone Encounter (Signed)
Left message for patient to call back to schedule consult per 5/1 referral.

## 2023-09-30 ENCOUNTER — Telehealth: Payer: Self-pay | Admitting: Radiation Oncology

## 2023-09-30 NOTE — Telephone Encounter (Signed)
Left message for patient to call back to schedule consult per 5/1 referral.

## 2023-10-01 ENCOUNTER — Telehealth: Payer: Self-pay | Admitting: Radiation Oncology

## 2023-10-01 NOTE — Telephone Encounter (Signed)
Left message for patient to call back to schedule consult per 5/1 referral.

## 2023-10-05 ENCOUNTER — Telehealth: Payer: Self-pay | Admitting: Radiation Oncology

## 2023-10-05 NOTE — Telephone Encounter (Signed)
 Mailed letter for patient to call back to schedule consult per 5/1 referral.

## 2023-10-11 ENCOUNTER — Encounter (HOSPITAL_COMMUNITY)
Admission: RE | Admit: 2023-10-11 | Discharge: 2023-10-11 | Disposition: A | Discharge status: Home / Self Care | Source: Ambulatory Visit | Attending: Urology | Admitting: Urology

## 2023-10-11 DIAGNOSIS — C61 Malignant neoplasm of prostate: Secondary | ICD-10-CM | POA: Insufficient documentation

## 2023-10-11 MED ORDER — FLOTUFOLASTAT F 18 GALLIUM 296-5846 MBQ/ML IV SOLN
8.1000 | Freq: Once | INTRAVENOUS | Status: AC
  Administered 2023-10-11: 8.1 via INTRAVENOUS

## 2023-10-25 ENCOUNTER — Other Ambulatory Visit: Payer: Self-pay | Admitting: Urology

## 2023-11-28 ENCOUNTER — Emergency Department (HOSPITAL_BASED_OUTPATIENT_CLINIC_OR_DEPARTMENT_OTHER)
Admission: EM | Admit: 2023-11-28 | Discharge: 2023-11-28 | Disposition: A | Discharge status: Home / Self Care | Attending: Emergency Medicine | Admitting: Emergency Medicine

## 2023-11-28 ENCOUNTER — Other Ambulatory Visit: Payer: Self-pay

## 2023-11-28 ENCOUNTER — Encounter (HOSPITAL_BASED_OUTPATIENT_CLINIC_OR_DEPARTMENT_OTHER): Payer: Self-pay | Admitting: Emergency Medicine

## 2023-11-28 DIAGNOSIS — Z8546 Personal history of malignant neoplasm of prostate: Secondary | ICD-10-CM | POA: Diagnosis not present

## 2023-11-28 DIAGNOSIS — E119 Type 2 diabetes mellitus without complications: Secondary | ICD-10-CM | POA: Insufficient documentation

## 2023-11-28 DIAGNOSIS — Z7984 Long term (current) use of oral hypoglycemic drugs: Secondary | ICD-10-CM | POA: Insufficient documentation

## 2023-11-28 DIAGNOSIS — Z7982 Long term (current) use of aspirin: Secondary | ICD-10-CM | POA: Diagnosis not present

## 2023-11-28 DIAGNOSIS — I2510 Atherosclerotic heart disease of native coronary artery without angina pectoris: Secondary | ICD-10-CM | POA: Insufficient documentation

## 2023-11-28 DIAGNOSIS — M25531 Pain in right wrist: Secondary | ICD-10-CM | POA: Insufficient documentation

## 2023-11-28 HISTORY — DX: Malignant neoplasm of prostate: C61

## 2023-11-28 HISTORY — DX: Type 2 diabetes mellitus without complications: E11.9

## 2023-11-28 MED ORDER — METHYLPREDNISOLONE SODIUM SUCC 125 MG IJ SOLR: 60.0000 mg | Freq: Once | INTRAMUSCULAR | Status: DC

## 2023-11-28 MED ORDER — METHYLPREDNISOLONE SODIUM SUCC 125 MG IJ SOLR
60.0000 mg | Freq: Once | INTRAMUSCULAR | Status: AC
  Administered 2023-11-28: 60 mg via INTRAMUSCULAR
  Filled 2023-11-28: qty 2

## 2023-11-28 MED ORDER — KETOROLAC TROMETHAMINE 30 MG/ML IJ SOLN
30.0000 mg | Freq: Once | INTRAMUSCULAR | Status: AC
  Administered 2023-11-28: 30 mg via INTRAMUSCULAR
  Filled 2023-11-28: qty 1

## 2023-11-28 MED ORDER — PREDNISONE 20 MG PO TABS: 40.0000 mg | ORAL_TABLET | Freq: Every day | ORAL | 0 refills | Status: AC

## 2023-11-28 NOTE — ED Provider Notes (Signed)
 [place] EMERGENCY DEPARTMENT AT [facility] Provider Note   CSN: [id number] Arrival date & time: 11/28/23  1320     Patient presents with: Wrist Pain   Brandon Henson is a 58 y.o. male.    Wrist Pain   59 year old male presents emergency department with a right-sided wrist pain.  States that the wrist has been bothering him for the past few days.  Has history of gout and typically involves his right wrist and right elbow.  States that he typically benefits from prednisone  in the outpatient setting.  States that he is on allopurinol daily which he has been taking.  Denies any known changes in diet.  States that he feels exactly like prior gout flareups.  Denies any fevers, chills, weakness/sensory deficits, no trauma/injury to affected wrist.  Past medical history significant for prostate cancer, diabetes mellitus type 2, gout, CAD  Prior to Admission medications   Medication Sig Start Date End Date Taking? Authorizing Provider  acetaminophen  (TYLENOL ) 500 MG tablet Take 2 tablets (1,000 mg total) by mouth every 6 (six) hours as needed. 07/31/20   Armenta Canning, MD  allopurinol (ZYLOPRIM) 100 MG tablet Take 100 mg by mouth daily. 12/03/22   [provider]  amLODipine (NORVASC) 10 MG tablet Take 10 mg by mouth daily. 04/29/20   [provider]  aspirin  81 MG chewable tablet Chew 1 tablet (81 mg total) by mouth daily. 07/31/20   Armenta Canning, MD  atorvastatin (LIPITOR) 10 MG tablet Take 10 mg by mouth daily. 06/21/20   [provider]  metFORMIN (GLUCOPHAGE) 500 MG tablet Take 500 mg by mouth 2 (two) times daily. 06/04/20   [provider]  omeprazole  (PRILOSEC) 20 MG capsule Take 1 capsule (20 mg total) by mouth daily. 07/31/20   Armenta Canning, MD  predniSONE  (DELTASONE ) 20 MG tablet 3 tabs po daily x 3 days, then 2 tabs x 3 days, then 1.5 tabs x 3 days, then 1 tab x 3 days, then 0.5 tabs x 3 days 06/06/23   Freddi Hamilton, MD   sildenafil (VIAGRA) 50 MG tablet Take 50 mg by mouth as needed for erectile dysfunction. 07/18/20   [provider]  telmisartan (MICARDIS) 20 MG tablet Take 20 mg by mouth daily.    [provider]    Allergies: Patient has no known allergies.    Review of Systems  All other systems reviewed and are negative.   Updated Vital Signs BP (!) 135/91   Pulse 87   Temp (!) 97.1 F (36.2 C)   Resp 20   Ht 6' 1 (1.854 m)   Wt (!) 160.6 kg   SpO2 97%   BMI 46.71 kg/m   Physical Exam Vitals and nursing note reviewed.  Constitutional:      General: He is not in acute distress.    Appearance: He is well-developed.  HENT:     Head: Normocephalic and atraumatic.  Eyes:     Conjunctiva/sclera: Conjunctivae normal.  Cardiovascular:     Rate and Rhythm: Normal rate and regular rhythm.     Heart sounds: No murmur heard. Pulmonary:     Effort: Pulmonary effort is normal. No respiratory distress.     Breath sounds: Normal breath sounds.  Abdominal:     Palpations: Abdomen is soft.     Tenderness: There is no abdominal tenderness.  Musculoskeletal:        General: No swelling.     Cervical back: Neck supple.  Comments: Patient will range right wrist digits of right hand fully.  Radial pulses 2+ bilaterally.  Swelling as well as slight erythema to the dorsal aspect of right wrist.  Tender to palpation over area.  Intact sensation distally.  Skin:    General: Skin is warm and dry.     Capillary Refill: Capillary refill takes less than 2 seconds.  Neurological:     Mental Status: He is alert.  Psychiatric:        Mood and Affect: Mood normal.     (all labs ordered are listed, but only abnormal results are displayed) Labs Reviewed - No data to display  EKG: None  Radiology: No results found.   Procedures   Medications Ordered in the ED  ketorolac  (TORADOL ) 30 MG/ML injection 30 mg (has no administration in time range)  methylPREDNISolone  sodium  succinate (SOLU-MEDROL ) 125 mg/2 mL injection 60 mg (has no administration in time range)                                    Medical Decision Making Risk Prescription drug management.   This patient presents to the ED for concern of wrist pain, this involves an extensive number of treatment options, and is a complaint that carries with it a high risk of complications and morbidity.  The differential diagnosis includes fracture, strain/pain, dislocation, ligamentous/tendinous injury, neurovascular compromise, septic arthritis, gout, ischemic limb, other   Co morbidities that complicate the patient evaluation  See HPI   Additional history obtained:  Additional history obtained from EMR External records from outside source obtained and reviewed including hospital records   Lab Tests:  N/a   Imaging Studies ordered:  N/a   Cardiac Monitoring: / EKG:  N/a   Consultations Obtained:  N/a   Problem List / ED Course / Critical interventions / Medication management  Wrist pain I ordered medication including Solu-Medrol , Toradol    Reevaluation of the patient after these medicines showed that the patient improved I have reviewed the patients home medicines and have made adjustments as needed   Social Determinants of Health:  Denies tobacco, licit drug use.   Test / Admission - Considered:  Right wrist pain Vitals signs within normal range and stable throughout visit. 58 year old male presents emergency department with a right-sided wrist pain.  States that the wrist has been bothering him for the past few days.  Has history of gout and typically involves his right wrist and right elbow.  States that he typically benefits from prednisone  in the outpatient setting.  States that he is on allopurinol daily which he has been taking.  Denies any known changes in diet.  States that he feels exactly like prior gout flareups.  Denies any fevers, chills, weakness/sensory  deficits, no trauma/injury to affected wrist. On exam, swelling and tenderness dorsal aspect of right wrist with slight erythema.  Patient able to range wrist fully; low suspicion for septic arthritis.  Patient does have history of gout typically involves the wrist and elbow with similar appearance in the past and similar presentations per chart review.  States that he benefits most from steroid injection and then a few days worth of prednisone .  Will trial the same and have patient monitor blood sugars at home as he is diabetic.  Will recommend follow-up with primary care in the outpatient setting for reassessment.  Will also recommend lifestyle/dietary changes to decrease likelihood of exacerbations  in the future.  Treatment plan discussed with patient and he acknowledged understanding was agreeable to said plan.  Patient will well-appearing, afebrile in no acute distress. Worrisome signs and symptoms were discussed with the patient, and the patient acknowledged understanding to return to the ED if noticed. Patient was stable upon discharge.       Final diagnoses:  None    ED Discharge Orders     None          Silver Wonda LABOR, [place] 11/28/23 1447    Yolande Lamar BROCKS, MD 12/04/23 1344

## 2023-11-28 NOTE — Discharge Instructions (Addendum)
 As discussed, wear wrist brace as needed to help with the healing process.  Take prednisone  as prescribed.  Recommend low purine diet to decrease likelihood of gout flareups.  Follow-up with primary care for reassessment.

## 2023-11-28 NOTE — ED Triage Notes (Signed)
 Pt c/o RT wrist pain and sts it is d/t gout

## 2023-12-15 ENCOUNTER — Encounter (HOSPITAL_COMMUNITY): Payer: Self-pay

## 2023-12-15 NOTE — Patient Instructions (Signed)
 SURGICAL WAITING ROOM VISITATION Patients having surgery or a procedure may have no more than 2 support people in the waiting area - these visitors may rotate.    Children under the age of 9 must have an adult with them who is not the patient.  If the patient needs to stay at the hospital during part of their recovery, the visitor guidelines for inpatient rooms apply. Pre-op nurse will coordinate an appropriate time for 1 support person to accompany patient in pre-op.  This support person may not rotate.    Please refer to the [facility] website for the visitor guidelines for Inpatients (after your surgery is over and you are in a regular room).       Your procedure is scheduled on: 12-20-23   Report to [facility] Main Entrance    Report to admitting at 9:00 AM   Call this number if you have problems the morning of surgery [phone]   Follow a clear liquid diet the day before surgery   Do not eat food or drink liquids :After Midnight.           If you have questions, please contact your surgeon's office.   FOLLOW BOWEL PREP AND ANY ADDITIONAL PRE OP INSTRUCTIONS YOU RECEIVED FROM YOUR SURGEON'S OFFICE!!!    Magnesium citrate - drink 8 ounces at noon the day before surgery   Fleet enema - administer the night before surgery   Oral Hygiene is also important to reduce your risk of infection.                                    Remember - BRUSH YOUR TEETH THE MORNING OF SURGERY WITH YOUR REGULAR TOOTHPASTE   Do NOT smoke after Midnight   Take these medicines the morning of surgery with A SIP OF WATER :    Allopurinol    Amlodipine    Omeprazole    Tylenol  if needed  Stop all vitamins and herbal supplements 7 days before surgery  How to Manage Your Diabetes Before and After Surgery  Why is it important to control my blood sugar before and after surgery? Improving blood sugar levels before and after surgery helps healing and can limit problems. A way of improving  blood sugar control is eating a healthy diet by:  Eating less sugar and carbohydrates  Increasing activity/exercise  Talking with your doctor about reaching your blood sugar goals High blood sugars (greater than 180 mg/dL) can raise your risk of infections and slow your recovery, so you will need to focus on controlling your diabetes during the weeks before surgery. Make sure that the doctor who takes care of your diabetes knows about your planned surgery including the date and location.  How do I manage my blood sugar before surgery? Check your blood sugar at least 4 times a day, starting 2 days before surgery, to make sure that the level is not too high or low. Check your blood sugar the morning of your surgery when you wake up and every 2 hours until you get to the Short Stay unit. If your blood sugar is less than 70 mg/dL, you will need to treat for low blood sugar: Do not take insulin . Treat a low blood sugar (less than 70 mg/dL) with  cup of clear juice (cranberry or apple), 4 glucose tablets, OR glucose gel. Recheck blood sugar in 15 minutes after treatment (to make sure it is  greater than 70 mg/dL). If your blood sugar is not greater than 70 mg/dL on recheck, call [phone] for further instructions. Report your blood sugar to the short stay nurse when you get to Short Stay.  If you are admitted to the hospital after surgery: Your blood sugar will be checked by the staff and you will probably be given insulin after surgery (instead of oral diabetes medicines) to make sure you have good blood sugar levels. The goal for blood sugar control after surgery is 80-180 mg/dL.   WHAT DO I DO ABOUT MY DIABETES MEDICATION?  Do not take oral diabetes medicines (pills) the morning of surgery.       Hold Mounjaro 7 days before surgery  DO NOT TAKE THE FOLLOWING 7 DAYS PRIOR TO SURGERY: Ozempic, Wegovy, Rybelsus (Semaglutide), Byetta (exenatide), Bydureon (exenatide ER), Victoza, Saxenda  (liraglutide), or Trulicity (dulaglutide) Mounjaro (Tirzepatide) Adlyxin (Lixisenatide), Polyethylene Glycol Loxenatide.  Reviewed and Endorsed by [facility] Patient Education Committee, August 2015                              You may not have any metal on your body including jewelry, and body piercing             Do not wear lotions, powders, cologne, or deodorant              Men may shave face and neck.   Do not bring valuables to the hospital. [facility] IS NOT RESPONSIBLE   FOR VALUABLES.   Contacts, dentures or bridgework may not be worn into surgery.   Bring small overnight bag day of surgery.   DO NOT BRING YOUR HOME MEDICATIONS TO THE HOSPITAL. PHARMACY WILL DISPENSE MEDICATIONS LISTED ON YOUR MEDICATION LIST TO YOU DURING YOUR ADMISSION IN THE HOSPITAL!              Please read over the following fact sheets you were given: IF YOU HAVE QUESTIONS ABOUT YOUR PRE-OP INSTRUCTIONS PLEASE CALL [phone] [name]  If you received a COVID test during your pre-op visit  it is requested that you wear a mask when out in public, stay away from anyone that may not be feeling well and notify your surgeon if you develop symptoms. If you test positive for Covid or have been in contact with anyone that has tested positive in the last 10 days please notify you surgeon.  [facility] - Preparing for Surgery Before surgery, you can play an important role.  Because skin is not sterile, your skin needs to be as free of germs as possible.  You can reduce the number of germs on your skin by washing with CHG (chlorahexidine gluconate) soap before surgery.  CHG is an antiseptic cleaner which kills germs and bonds with the skin to continue killing germs even after washing. Please DO NOT use if you have an allergy to CHG or antibacterial soaps.  If your skin becomes reddened/irritated stop using the CHG and inform your nurse when you arrive at Short Stay. Do not shave (including legs and underarms) for at  least 48 hours prior to the first CHG shower.  You may shave your face/neck.  Please follow these instructions carefully:  1.  Shower with CHG Soap the night before surgery and the  morning of surgery.  2.  If you choose to wash your hair, wash your hair first as usual with your normal  shampoo.  3.  After you shampoo, rinse your hair and body thoroughly to remove the shampoo.                             4.  Use CHG as you would any other liquid soap.  You can apply chg directly to the skin and wash.  Gently with a scrungie or clean washcloth.  5.  Apply the CHG Soap to your body ONLY FROM THE NECK DOWN.   Do   not use on face/ open                           Wound or open sores. Avoid contact with eyes, ears mouth and   genitals (private parts).                       Wash face,  Genitals (private parts) with your normal soap.             6.  Wash thoroughly, paying special attention to the area where your    surgery  will be performed.  7.  Thoroughly rinse your body with warm water from the neck down.  8.  DO NOT shower/wash with your normal soap after using and rinsing off the CHG Soap.                9.  Pat yourself dry with a clean towel.            10.  Wear clean pajamas.            11.  Place clean sheets on your bed the night of your first shower and do not  sleep with pets. Day of Surgery : Do not apply any lotions/deodorants the morning of surgery.  Please wear clean clothes to the hospital/surgery center.  FAILURE TO FOLLOW THESE INSTRUCTIONS MAY RESULT IN THE CANCELLATION OF YOUR SURGERY  PATIENT SIGNATURE_________________________________  NURSE SIGNATURE__________________________________  ________________________________________________________________________   WHAT IS A BLOOD TRANSFUSION? Blood Transfusion Information  A transfusion is the replacement of blood or some of its parts. Blood is made up of multiple cells which provide different functions. Red blood cells  carry oxygen and are used for blood loss replacement. White blood cells fight against infection. Platelets control bleeding. Plasma helps clot blood. Other blood products are available for specialized needs, such as hemophilia or other clotting disorders. BEFORE THE TRANSFUSION  Who gives blood for transfusions?  Healthy volunteers who are fully evaluated to make sure their blood is safe. This is blood bank blood. Transfusion therapy is the safest it has ever been in the practice of medicine. Before blood is taken from a donor, a complete history is taken to make sure that person has no history of diseases nor engages in risky social behavior (examples are intravenous drug use or sexual activity with multiple partners). The donor's travel history is screened to minimize risk of transmitting infections, such as malaria. The donated blood is tested for signs of infectious diseases, such as HIV and hepatitis. The blood is then tested to be sure it is compatible with you in order to minimize the chance of a transfusion reaction. If you or a relative donates blood, this is often done in anticipation of surgery and is not appropriate for emergency situations. It takes many days to process the donated blood. RISKS AND COMPLICATIONS Although transfusion therapy is very safe and  saves many lives, the main dangers of transfusion include:  Getting an infectious disease. Developing a transfusion reaction. This is an allergic reaction to something in the blood you were given. Every precaution is taken to prevent this. The decision to have a blood transfusion has been considered carefully by your caregiver before blood is given. Blood is not given unless the benefits outweigh the risks. AFTER THE TRANSFUSION Right after receiving a blood transfusion, you will usually feel much better and more energetic. This is especially true if your red blood cells have gotten low (anemic). The transfusion raises the level of the  red blood cells which carry oxygen, and this usually causes an energy increase. The nurse administering the transfusion will monitor you carefully for complications. HOME CARE INSTRUCTIONS  No special instructions are needed after a transfusion. You may find your energy is better. Speak with your caregiver about any limitations on activity for underlying diseases you may have. SEEK MEDICAL CARE IF:  Your condition is not improving after your transfusion. You develop redness or irritation at the intravenous (IV) site. SEEK IMMEDIATE MEDICAL CARE IF:  Any of the following symptoms occur over the next 12 hours: Shaking chills. You have a temperature by mouth above 102 F (38.9 C), not controlled by medicine. Chest, back, or muscle pain. People around you feel you are not acting correctly or are confused. Shortness of breath or difficulty breathing. Dizziness and fainting. You get a rash or develop hives. You have a decrease in urine output. Your urine turns a dark color or changes to pink, red, or brown. Any of the following symptoms occur over the next 10 days: You have a temperature by mouth above 102 F (38.9 C), not controlled by medicine. Shortness of breath. Weakness after normal activity. The white part of the eye turns yellow (jaundice). You have a decrease in the amount of urine or are urinating less often. Your urine turns a dark color or changes to pink, red, or brown. Document Released: 04/24/2000 Document Revised: 07/20/2011 Document Reviewed: 12/12/2007 [facility] Patient Information 2014 [place], [place].  _______________________________________________________________________

## 2023-12-15 NOTE — Progress Notes (Addendum)
  Date of COVID positive in last 90 days:  No  PCP - [employer], MD (office note in media) Cardiologist - Medford Nanas, MD (last OV 2022)  Chest x-ray - N/A EKG - 12-16-23 Epic Stress Test - Yes ECHO - N/A Cardiac Cath - 2004 Pacemaker/ICD device last checked: N/A Spinal Cord Stimulator: N/A  Bowel Prep - N/A  Sleep Study - Yes, +sleep apnea CPAP - no longer uses, could not tolerate mask  Fasting Blood Sugar -  Checks Blood Sugar - does not check   Mounjaro Last dose of GLP1 agonist-  12-18-23 GLP1 instructions:  Hold until after surgery     Last dose of SGLT-2 inhibitors-  N/A SGLT-2 instructions:  Do not take after    Blood Thinner Instructions:  N/A Aspirin  Instructions:  N/A Last Dose:  Activity level:  Can go up a flight of stairs and perform activities of daily living without stopping and without symptoms of chest pain or shortness of breath.  Anesthesia review:  CAD risk factors per cardiology, HTN, DM, OSA  BP elevated at preop 173/114 and on recheck 165/102.  Patient very nervous at preop and has a needed phobia.  Denies headache, chest pain or SOB.  He is not able to monitor at home.    Patient denies shortness of breath, fever, cough and chest pain at PAT appointment  Patient verbalized understanding of instructions that were given to them at the PAT appointment. Patient was also instructed that they will need to review over the PAT instructions again at home before surgery.

## 2023-12-16 ENCOUNTER — Other Ambulatory Visit: Payer: Self-pay

## 2023-12-16 ENCOUNTER — Encounter (HOSPITAL_COMMUNITY)
Admission: RE | Admit: 2023-12-16 | Discharge: 2023-12-16 | Disposition: A | Discharge status: Home / Self Care | Source: Ambulatory Visit | Attending: Urology | Admitting: Urology

## 2023-12-16 ENCOUNTER — Encounter (HOSPITAL_COMMUNITY): Payer: Self-pay

## 2023-12-16 VITALS — BP 165/102 | HR 80 | Temp 98.2°F | Resp 18 | Ht 73.0 in | Wt 351.2 lb

## 2023-12-16 DIAGNOSIS — G4730 Sleep apnea, unspecified: Secondary | ICD-10-CM | POA: Diagnosis not present

## 2023-12-16 DIAGNOSIS — C61 Malignant neoplasm of prostate: Secondary | ICD-10-CM | POA: Diagnosis not present

## 2023-12-16 DIAGNOSIS — E119 Type 2 diabetes mellitus without complications: Secondary | ICD-10-CM | POA: Insufficient documentation

## 2023-12-16 DIAGNOSIS — I10 Essential (primary) hypertension: Secondary | ICD-10-CM | POA: Insufficient documentation

## 2023-12-16 DIAGNOSIS — I444 Left anterior fascicular block: Secondary | ICD-10-CM | POA: Insufficient documentation

## 2023-12-16 DIAGNOSIS — Z01818 Encounter for other preprocedural examination: Secondary | ICD-10-CM | POA: Diagnosis present

## 2023-12-16 HISTORY — DX: Unspecified osteoarthritis, unspecified site: M19.90

## 2023-12-16 HISTORY — DX: Anxiety disorder, unspecified: F41.9

## 2023-12-16 HISTORY — DX: Gastro-esophageal reflux disease without esophagitis: K21.9

## 2023-12-16 HISTORY — DX: Sleep apnea, unspecified: G47.30

## 2023-12-16 LAB — HEMOGLOBIN A1C
Hgb A1c MFr Bld: 7 % — ABNORMAL HIGH (ref 4.8–5.6)
Mean Plasma Glucose: 154 mg/dL

## 2023-12-16 LAB — BASIC METABOLIC PANEL WITH GFR
Anion gap: 9 (ref 5–15)
BUN: 10 mg/dL (ref 6–20)
CO2: 26 mmol/L (ref 22–32)
Calcium: 9.4 mg/dL (ref 8.9–10.3)
Chloride: 101 mmol/L (ref 98–111)
Creatinine, Ser: 0.96 mg/dL (ref 0.61–1.24)
GFR, Estimated: >60 mL/min (ref >60)
Glucose, Bld: 114 mg/dL — ABNORMAL HIGH (ref 70–99)
Potassium: 4.2 mmol/L (ref 3.5–5.1)
Sodium: 136 mmol/L (ref 135–145)

## 2023-12-16 LAB — CBC
HCT: 46.5 % (ref 39.0–52.0)
Hemoglobin: 14.1 g/dL (ref 13.0–17.0)
MCH: 21.3 pg — ABNORMAL LOW (ref 26.0–34.0)
MCHC: 30.3 g/dL (ref 30.0–36.0)
MCV: 70.2 fL — ABNORMAL LOW (ref 80.0–100.0)
Platelets: 259 K/uL (ref 150–400)
RBC: 6.62 MIL/uL — ABNORMAL HIGH (ref 4.22–5.81)
RDW: 17.1 % — ABNORMAL HIGH (ref 11.5–15.5)
WBC: 5.8 K/uL (ref 4.0–10.5)
nRBC: 0 % (ref 0.0–0.2)

## 2023-12-16 LAB — GLUCOSE, CAPILLARY: Glucose-Capillary: 126 mg/dL — ABNORMAL HIGH (ref 70–99)

## 2023-12-17 NOTE — Progress Notes (Signed)
 Case: [id number] Date/Time: 12/20/23 1100   Procedures:      PROSTATECTOMY, RADICAL, ROBOT-ASSISTED, LAPAROSCOPIC - LEVEL 3     LYMPHADENECTOMY, PELVIS, ROBOT-ASSISTED (Bilateral)   Anesthesia type: General   Diagnosis: Prostate cancer (HCC) [C61]   Pre-op diagnosis: PROSTATE CANCER   Location: [facility] ROOM 03 / [facility]   Surgeons: Renda Glance, MD       DISCUSSION: Brandon Henson is a 58 year old male who presents to PAT prior to surgery above. PMH of HTN, OSA (no CPAP), migraines, T2DM (7.0), GERD, arthritis, prostate cancer, obesity (BMI 46)  Patient seen by PCP on 12/15/2023.  Blood pressure was elevated at PAT visit but controlled at PCP visit. Patient no longer uses CPAP due to intolerance. DM controlled.   Seen by cardiology in 2022 for chest pain.  It was recommended he complete a CT calcium score, stress test, and sleep study due to risk factors however these were never completed.  Per chart review he had a cardiac cath due to chest pain in [place]  in 2004/2005 which was normal. At PAT patient reports he can go up a flight of stairs without chest pain or shortness of breath. Anticipate he can proceed   VS: BP (!) 165/102   Pulse 80   Temp 36.8 C (Oral)   Resp 18   Ht 6' 1 (1.854 m)   Wt (!) 159.3 kg   SpO2 99%   BMI 46.34 kg/m   PROVIDERS: Kip Righter, MD   LABS: Labs reviewed: Acceptable for surgery. (all labs ordered are listed, but only abnormal results are displayed)  Labs Reviewed  CBC - Abnormal; Notable for the following components:      Result Value   RBC 6.62 (*)    MCV 70.2 (*)    MCH 21.3 (*)    RDW 17.1 (*)    All other components within normal limits  BASIC METABOLIC PANEL WITH GFR - Abnormal; Notable for the following components:   Glucose, Bld 114 (*)    All other components within normal limits  HEMOGLOBIN A1C - Abnormal; Notable for the following components:   Hgb A1c MFr Bld 7.0 (*)    All other components within normal limits   GLUCOSE, CAPILLARY - Abnormal; Notable for the following components:   Glucose-Capillary 126 (*)    All other components within normal limits  TYPE AND SCREEN     IMAGES: PET scan 10/11/2023:  IMPRESSION: 1. Focal radiotracer activity in the LEFT mid gland prostate consistent with primary prostate adenocarcinoma. 2. No evidence of metastatic adenopathy in the pelvis or periaortic retroperitoneum. 3. No evidence of visceral metastasis or skeletal metastasis.  EKG 12/16/2023: Normal sinus rhythm, rate 81 Left anterior fascicular block Minimal voltage criteria for LVH, may be normal variant ( R in aVL ) Abnormal ECG  CV:  Past Medical History:  Diagnosis Date   Anxiety    Arthritis    Coronary artery disease    GERD (gastroesophageal reflux disease)    Gout    Hypertension    Prostate cancer (HCC)    Sleep apnea    Type 2 diabetes mellitus (HCC)     Past Surgical History:  Procedure Laterality Date   PROSTATE BIOPSY     WISDOM TOOTH EXTRACTION      MEDICATIONS:  allopurinol (ZYLOPRIM) 100 MG tablet   amLODipine (NORVASC) 10 MG tablet   aspirin  81 MG chewable tablet   b complex vitamins capsule   cholecalciferol (VITAMIN D3) 25 MCG (1000  UNIT) tablet   MOUNJARO 7.5 MG/0.5ML Pen   omeprazole  (PRILOSEC) 20 MG capsule   sildenafil (VIAGRA) 50 MG tablet   telmisartan (MICARDIS) 40 MG tablet   No current facility-administered medications for this encounter.    Burnard CHRISTELLA Odis DEVONNA [facility] Short Stay/Anesthesiology [facility] Phone [phone] 12/17/2023 9:43 AM

## 2023-12-17 NOTE — Anesthesia Preprocedure Evaluation (Addendum)
 Anesthesia Evaluation  Patient identified by MRN, date of birth, ID band Patient awake    Reviewed: Allergy & Precautions, H&P , NPO status , Patient's Chart, lab work & pertinent test results  Airway Mallampati: II  TM Distance: >3 FB Neck ROM: Full    Dental no notable dental hx. (+) Teeth Intact, Dental Advisory Given   Pulmonary sleep apnea    Pulmonary exam normal breath sounds clear to auscultation       Cardiovascular hypertension, Pt. on medications  Rhythm:Regular Rate:Normal     Neuro/Psych   Anxiety     negative neurological ROS     GI/Hepatic Neg liver ROS,GERD  Medicated,,  Endo/Other  diabetes, Type 2, Oral Hypoglycemic Agents  Class 3 obesity  Renal/GU negative Renal ROS  negative genitourinary   Musculoskeletal  (+) Arthritis , Osteoarthritis,    Abdominal   Peds  Hematology negative hematology ROS (+)   Anesthesia Other Findings   Reproductive/Obstetrics negative OB ROS                              Anesthesia Physical Anesthesia Plan  ASA: 3  Anesthesia Plan: General   Post-op Pain Management: Ofirmev  IV (intra-op)*   Induction: Intravenous  PONV Risk Score and Plan: 3 and Ondansetron , Dexamethasone  and Midazolam   Airway Management Planned: Oral ETT  Additional Equipment:   Intra-op Plan:   Post-operative Plan: Extubation in OR  Informed Consent: I have reviewed the patients History and Physical, chart, labs and discussed the procedure including the risks, benefits and alternatives for the proposed anesthesia with the patient or authorized representative who has indicated his/her understanding and acceptance.     Dental advisory given  Plan Discussed with: CRNA  Anesthesia Plan Comments: (See PAT note from 8/7)         Anesthesia Quick Evaluation

## 2023-12-17 NOTE — H&P (Signed)
 Office Visit Report     12/13/2023   --------------------------------------------------------------------------------   Brandon Henson  MRN: [id number]  DOB: 11/11/1965, 58 year old Male  SSN:    PRIMARY CARE:     REFERRING:  Gretel Renda Raddle, CHRISTELLA  PROVIDER:  Lonni Han, M.D.  TREATING:  Ubaldo Eagles, NP  LOCATION:  [facility] [phone]     --------------------------------------------------------------------------------   CC/HPI: 12/13/2023: Patient here today for preoperative appointment prior to undergoing unilateral right nerve sparing robot-assisted laparoscopic radical prostatectomy and bilateral pelvic lymphadenectomy with Dr. Renda on 8/11. Patient has already been seen and evaluated by pelvic floor physical therapy, he has been doing these exercises at home per the PT provider's instruction. He denies any changes in past medical history. He has been taking Mounjaro now, he typically injects on Saturday so his last dose was approximately 2 days ago and he understands to hold this upcoming weekend's scheduled dose. Denies any other changes in prescription medications taken on a daily basis. His med list was updated today. No interval surgical or procedural intervention. He continues to void at this baseline with grossly stable symptomology. No interval dysuria or gross hematuria. He denies any recent fever/chills, nausea/vomiting, chest pain or shortness of breath.    CC: Prostate Cancer   Physician requesting consult: Dr. Medford Han  PCP:   Brandon Henson is a 58 year old gentleman who was found to have an elevated PSA of 12.7 prompting an MRI of the prostate on 06/13/23 that demonstrated a 2.5 cm left anterior PI-RADS 3 lesion. 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   Family history: None.   Imaging  studies:  MRI (08/26/23): No EPE, SVI, LAD, or bone lesions.  PSMA PET scan (PENDING)   PMH: He has a history of gout, hypertension, and diabetes. He is obese and weighs approximately 370 pounds.  PSH: No abdominal surgeries.   TNM stage: cT1c N0 M0  PSA: 12.7  Gleason score: 3+5=8 (GG 4)  Biopsy (08/26/23): 9/16 cores positive  Left: L lateral apex (80%, 3+5=8), L apex (50%, 3+4=7), L lateral mid (80%, 4+3=7), L mid (80%, 4+3=7, PNI), L lateral base (80%, 4+3=7)  Right: Benign  MR target: 4/4 cores (3 cores with Gleason 3+5=8 in 90%, 90%, and 90%, 1 core with Gleason 4+3=7 in 90%)  Prostate volume: 55.9 cc   Nomogram  OC disease: 38%  EPE: 58%  SVI: 13%  LNI: 17%  PFS (5 year, 10 year): 42%, 27%   Urinary function: IPSS is 7. His main complaint is nocturia 3 times per night.  Erectile function: SHIM score is 11. He estimates that he can get erections adequate for intercourse 3 out of 10 times. PDE 5 inhibitors have not been helpful.     ALLERGIES: No Allergies    MEDICATIONS: Allopurinol 200 MG Tablet  Omeprazole  20 MG Tablet Delayed Release  amLODIPine Besylate 10 MG Tablet  Colchicine 0.6 MG Tablet  Diclofenac Sodium 50 MG Tablet Delayed Release  Meclizine HCl 25 MG Tablet  Mounjaro 7.5 MG/0.5ML Solution Auto-injector  Naproxen 500 MG Tablet  Ofloxacin 0.3 % Solution  Sildenafil Citrate 50 MG Tablet  Telmisartan 40 MG Tablet  Vitamin D3 1.25 MG (50000 UT) Capsule     GU PSH: Prostate Needle Biopsy - 08/26/2023     NON-GU PSH: Surgical Pathology, Gross And Microscopic  Examination For Prostate Needle - 08/26/2023     GU PMH: Stress Incontinence - 12/06/2023 Prostate Cancer - 11/01/2023, - 09/28/2023, - 09/09/2023 Elevated PSA - 08/26/2023, - 01/07/2023    NON-GU PMH: Muscle weakness (generalized) - 12/06/2023, - 11/01/2023 Other muscle spasm - 12/06/2023 Diabetes Type 2 Gout Hypertension    FAMILY HISTORY: Hypertension - Runs in Family   SOCIAL HISTORY: Marital  Status: Married Preferred Language: English; Ethnicity: Not Hispanic Or Latino; Race: Black or African American Current Smoking Status: Patient has never smoked.   Tobacco Use Assessment Completed: Used Tobacco in last 30 days? Has never drank.  Drinks 1 caffeinated drink per day.    REVIEW OF SYSTEMS:    GU Review Male:   Patient denies frequent urination, hard to postpone urination, burning/ pain with urination, get up at night to urinate, leakage of urine, stream starts and stops, trouble starting your stream, have to strain to urinate , erection problems, and penile pain.  Gastrointestinal (Upper):   Patient denies nausea, vomiting, and indigestion/ heartburn.  Gastrointestinal (Lower):   Patient denies diarrhea and constipation.  Constitutional:   Patient denies fever, night sweats, weight loss, and fatigue.  Skin:   Patient denies skin rash/ lesion and itching.  Eyes:   Patient denies blurred vision and double vision.  Ears/ Nose/ Throat:   Patient denies sore throat and sinus problems.  Hematologic/Lymphatic:   Patient denies swollen glands and easy bruising.  Cardiovascular:   Patient denies leg swelling and chest pains.  Respiratory:   Patient denies cough and shortness of breath.  Endocrine:   Patient denies excessive thirst.  Musculoskeletal:   Patient denies back pain and joint pain.  Neurological:   Patient denies headaches and dizziness.  Psychologic:   Patient denies depression and anxiety.   VITAL SIGNS:      12/13/2023 08:09 AM  BP 150/98 mmHg  Temperature 97.7 F / 36.5 C   MULTI-SYSTEM PHYSICAL EXAMINATION:    Constitutional: Well-nourished. No physical deformities. Normally developed. Good grooming.  Neck: Neck symmetrical, not swollen. Normal tracheal position.  Respiratory: No labored breathing, no use of accessory muscles.   Cardiovascular: Normal temperature, normal extremity pulses, no swelling, no varicosities.  Skin: No paleness, no jaundice, no cyanosis.  No lesion, no ulcer, no rash.  Neurologic / Psychiatric: Oriented to time, oriented to place, oriented to person. No depression, no anxiety, no agitation.  Gastrointestinal: Obese abdomen. No mass, no tenderness, no rigidity.   Musculoskeletal: Normal gait and station of head and neck.     Complexity of Data:  Source Of History:  Patient, Medical Record Summary  Lab Test Review:   PSA  Records Review:   Previous Doctor Records, Previous Hospital Records, Previous Patient Records  Urine Test Review:   Urinalysis  X-Ray Review: PET- PSMA Scan: Reviewed Report.  MRI Prostate GSORAD: Reviewed Report.     12/13/23  Urinalysis  Urine Appearance Clear   Urine Color Yellow   Urine Glucose Neg mg/dL  Urine Bilirubin Neg mg/dL  Urine Ketones Neg mg/dL  Urine Specific Gravity 1.020   Urine Blood Neg ery/uL  Urine pH <=5.0   Urine Protein Neg mg/dL  Urine Urobilinogen 0.2 mg/dL  Urine Nitrites Neg   Urine Leukocyte Esterase Neg leu/uL   PROCEDURES:          Urinalysis Dipstick Dipstick Cont'd  Color: Yellow Bilirubin: Neg mg/dL  Appearance: Clear Ketones: Neg mg/dL  Specific Gravity: 8.979 Blood: Neg ery/uL  pH: <=5.0 Protein: Neg mg/dL  Glucose: Neg mg/dL Urobilinogen: 0.2 mg/dL    Nitrites: Neg    Leukocyte Esterase: Neg leu/uL    ASSESSMENT:      ICD-10 Details  1 GU:   Prostate Cancer - C61 Chronic, Threat to Bodily Function  2 NON-GU:   Encounter for other preprocedural examination - Z01.818 Undiagnosed New Problem   PLAN:            Medications Stop Meds: levoFLOXacin 750 MG Tablet 1 tablet PO As Directed Take one hour prior to your scheduled procedure. Start: 08/11/2023  Discontinue: 12/13/2023  - Reason: The medication cycle was completed.            Orders Labs Urine Culture          Schedule Return Visit/Planned Activity: Keep Scheduled Appointment - Follow up MD, Schedule Surgery          Document Letter(s):  Created for Patient: Clinical Summary          Notes:   All questions answered to the best of my ability regarding the upcoming procedure and expected postoperative course with understanding expressed by the patient. Precautionary urine culture sent today to serve as a baseline. He will proceed with previously scheduled robotic prostatectomy with Dr. Renda on 8/11.        Next Appointment:      Next Appointment: 12/20/2023 11:15 AM    Appointment Type: Surgery     Location: [facility] [phone]    Provider: Gretel Renda, M.D.    Reason for Visit: WL/OBS RALP LEV 3 AND BPLND WITH AMANDA $500.00 due 08/4      * Signed by Ubaldo Eagles, NP on 12/13/23 at 8:28 AM (EDT)*

## 2023-12-20 ENCOUNTER — Encounter (HOSPITAL_COMMUNITY): Payer: Self-pay | Admitting: Urology

## 2023-12-20 ENCOUNTER — Ambulatory Visit (HOSPITAL_COMMUNITY): Payer: Self-pay | Admitting: Medical

## 2023-12-20 ENCOUNTER — Other Ambulatory Visit: Payer: Self-pay

## 2023-12-20 ENCOUNTER — Encounter (HOSPITAL_COMMUNITY)
Admission: RE | Disposition: A | Discharge status: Home / Self Care | Payer: Self-pay | Source: Home / Self Care | Attending: Urology

## 2023-12-20 ENCOUNTER — Observation Stay (HOSPITAL_COMMUNITY)
Admission: RE | Admit: 2023-12-20 | Discharge: 2023-12-21 | Disposition: A | Discharge status: Home / Self Care | Attending: Urology | Admitting: Urology

## 2023-12-20 ENCOUNTER — Ambulatory Visit (HOSPITAL_BASED_OUTPATIENT_CLINIC_OR_DEPARTMENT_OTHER): Admitting: Certified Registered"

## 2023-12-20 DIAGNOSIS — C61 Malignant neoplasm of prostate: Secondary | ICD-10-CM | POA: Diagnosis present

## 2023-12-20 DIAGNOSIS — E119 Type 2 diabetes mellitus without complications: Secondary | ICD-10-CM

## 2023-12-20 DIAGNOSIS — I10 Essential (primary) hypertension: Secondary | ICD-10-CM

## 2023-12-20 DIAGNOSIS — G4733 Obstructive sleep apnea (adult) (pediatric): Secondary | ICD-10-CM | POA: Diagnosis not present

## 2023-12-20 DIAGNOSIS — Z79899 Other long term (current) drug therapy: Secondary | ICD-10-CM | POA: Diagnosis not present

## 2023-12-20 HISTORY — PX: ROBOT ASSISTED LAPAROSCOPIC RADICAL PROSTATECTOMY: SHX5141

## 2023-12-20 LAB — HEMOGLOBIN AND HEMATOCRIT, BLOOD
HCT: 45.6 % (ref 39.0–52.0)
Hemoglobin: 13.5 g/dL (ref 13.0–17.0)

## 2023-12-20 LAB — TYPE AND SCREEN
ABO/RH(D): O POS
Antibody Screen: NEGATIVE

## 2023-12-20 LAB — ABO/RH: ABO/RH(D): O POS

## 2023-12-20 LAB — GLUCOSE, CAPILLARY
Glucose-Capillary: 123 mg/dL — ABNORMAL HIGH (ref 70–99)
Glucose-Capillary: 127 mg/dL — ABNORMAL HIGH (ref 70–99)

## 2023-12-20 SURGERY — PROSTATECTOMY, RADICAL, ROBOT-ASSISTED, LAPAROSCOPIC
Anesthesia: General | Site: Prostate

## 2023-12-20 MED ORDER — ONDANSETRON HCL 4 MG/2ML IJ SOLN
INTRAMUSCULAR | Status: DC | PRN
  Administered 2023-12-20 (×2): 4 mg via INTRAVENOUS

## 2023-12-20 MED ORDER — ROCURONIUM BROMIDE 10 MG/ML (PF) SYRINGE
PREFILLED_SYRINGE | INTRAVENOUS | Status: AC
  Filled 2023-12-20: qty 10

## 2023-12-20 MED ORDER — MIDAZOLAM HCL 2 MG/2ML IJ SOLN
INTRAMUSCULAR | Status: AC
Start: 2023-12-20 — End: 2023-12-20
  Filled 2023-12-20: qty 2

## 2023-12-20 MED ORDER — HEPARIN SODIUM (PORCINE) 1000 UNIT/ML IJ SOLN: INTRAMUSCULAR | Status: DC | PRN

## 2023-12-20 MED ORDER — AMLODIPINE BESYLATE 10 MG PO TABS
10.0000 mg | ORAL_TABLET | Freq: Every day | ORAL | Status: DC
Start: 2023-12-21 — End: 2023-12-21
  Administered 2023-12-21 (×2): 10 mg via ORAL
  Filled 2023-12-20: qty 1

## 2023-12-20 MED ORDER — HYOSCYAMINE SULFATE 0.125 MG SL SUBL
0.1250 mg | SUBLINGUAL_TABLET | Freq: Four times a day (QID) | SUBLINGUAL | Status: DC | PRN
  Administered 2023-12-20 (×2): 0.125 mg via SUBLINGUAL
  Filled 2023-12-20: qty 1

## 2023-12-20 MED ORDER — STERILE WATER FOR IRRIGATION IR SOLN
Status: DC | PRN
  Administered 2023-12-20 (×2): 1000 mL

## 2023-12-20 MED ORDER — PHENYLEPHRINE 80 MCG/ML (10ML) SYRINGE FOR IV PUSH (FOR BLOOD PRESSURE SUPPORT)
PREFILLED_SYRINGE | INTRAVENOUS | Status: DC | PRN
  Administered 2023-12-20 (×2): 80 ug via INTRAVENOUS
  Administered 2023-12-20: 160 ug via INTRAVENOUS
  Administered 2023-12-20 (×9): 80 ug via INTRAVENOUS
  Administered 2023-12-20: 160 ug via INTRAVENOUS
  Administered 2023-12-20: 80 ug via INTRAVENOUS

## 2023-12-20 MED ORDER — TRAMADOL HCL 50 MG PO TABS: 50.0000 mg | ORAL_TABLET | Freq: Four times a day (QID) | ORAL | 0 refills | Status: AC | PRN

## 2023-12-20 MED ORDER — ROCURONIUM BROMIDE 10 MG/ML (PF) SYRINGE
PREFILLED_SYRINGE | INTRAVENOUS | Status: DC | PRN
  Administered 2023-12-20: 10 mg via INTRAVENOUS
  Administered 2023-12-20: 70 mg via INTRAVENOUS
  Administered 2023-12-20: 30 mg via INTRAVENOUS
  Administered 2023-12-20 (×2): 10 mg via INTRAVENOUS
  Administered 2023-12-20: 70 mg via INTRAVENOUS
  Administered 2023-12-20: 10 mg via INTRAVENOUS
  Administered 2023-12-20: 30 mg via INTRAVENOUS
  Administered 2023-12-20 (×2): 10 mg via INTRAVENOUS

## 2023-12-20 MED ORDER — SUGAMMADEX SODIUM 200 MG/2ML IV SOLN
INTRAVENOUS | Status: AC
  Filled 2023-12-20: qty 4

## 2023-12-20 MED ORDER — FENTANYL CITRATE (PF) 100 MCG/2ML IJ SOLN
INTRAMUSCULAR | Status: AC
Start: 2023-12-20 — End: 2023-12-20
  Filled 2023-12-20: qty 2

## 2023-12-20 MED ORDER — BUPIVACAINE-EPINEPHRINE (PF) 0.25% -1:200000 IJ SOLN
INTRAMUSCULAR | Status: DC | PRN
  Administered 2023-12-20 (×2): 30 mL

## 2023-12-20 MED ORDER — PANTOPRAZOLE SODIUM 40 MG PO TBEC
40.0000 mg | DELAYED_RELEASE_TABLET | Freq: Every day | ORAL | Status: DC
  Administered 2023-12-21 (×2): 40 mg via ORAL
  Filled 2023-12-20: qty 1

## 2023-12-20 MED ORDER — LIDOCAINE HCL (PF) 2 % IJ SOLN
INTRAMUSCULAR | Status: AC
  Filled 2023-12-20: qty 5

## 2023-12-20 MED ORDER — KETOROLAC TROMETHAMINE 15 MG/ML IJ SOLN
15.0000 mg | Freq: Four times a day (QID) | INTRAMUSCULAR | Status: DC
  Administered 2023-12-20 – 2023-12-21 (×8): 15 mg via INTRAVENOUS
  Filled 2023-12-20 (×4): qty 1

## 2023-12-20 MED ORDER — MIDAZOLAM HCL 2 MG/2ML IJ SOLN
INTRAMUSCULAR | Status: DC | PRN
  Administered 2023-12-20 (×2): 2 mg via INTRAVENOUS

## 2023-12-20 MED ORDER — HYDROMORPHONE HCL 1 MG/ML IJ SOLN
0.2500 mg | INTRAMUSCULAR | Status: DC | PRN
  Administered 2023-12-20 (×2): 0.5 mg via INTRAVENOUS

## 2023-12-20 MED ORDER — DOCUSATE SODIUM 100 MG PO CAPS: 100.0000 mg | ORAL_CAPSULE | Freq: Two times a day (BID) | ORAL | Status: AC

## 2023-12-20 MED ORDER — KCL IN DEXTROSE-NACL 20-5-0.45 MEQ/L-%-% IV SOLN
INTRAVENOUS | Status: DC
  Filled 2023-12-20 (×2): qty 1000

## 2023-12-20 MED ORDER — ACETAMINOPHEN 325 MG PO TABS
650.0000 mg | ORAL_TABLET | ORAL | Status: DC | PRN
  Administered 2023-12-20 – 2023-12-21 (×4): 650 mg via ORAL
  Filled 2023-12-20 (×2): qty 2

## 2023-12-20 MED ORDER — SUGAMMADEX SODIUM 200 MG/2ML IV SOLN
INTRAVENOUS | Status: DC | PRN
  Administered 2023-12-20 (×2): 400 mg via INTRAVENOUS

## 2023-12-20 MED ORDER — PROPOFOL 10 MG/ML IV BOLUS
INTRAVENOUS | Status: AC
  Filled 2023-12-20: qty 20

## 2023-12-20 MED ORDER — DEXAMETHASONE SODIUM PHOSPHATE 10 MG/ML IJ SOLN
INTRAMUSCULAR | Status: AC
  Filled 2023-12-20: qty 1

## 2023-12-20 MED ORDER — BUPIVACAINE-EPINEPHRINE (PF) 0.25% -1:200000 IJ SOLN
INTRAMUSCULAR | Status: AC
  Filled 2023-12-20: qty 30

## 2023-12-20 MED ORDER — MORPHINE SULFATE (PF) 2 MG/ML IV SOLN
2.0000 mg | INTRAVENOUS | Status: DC | PRN
  Administered 2023-12-20 (×2): 2 mg via INTRAVENOUS
  Administered 2023-12-21 (×2): 4 mg via INTRAVENOUS
  Filled 2023-12-20 (×2): qty 2
  Filled 2023-12-20: qty 1

## 2023-12-20 MED ORDER — DOCUSATE SODIUM 100 MG PO CAPS
100.0000 mg | ORAL_CAPSULE | Freq: Two times a day (BID) | ORAL | Status: DC
  Administered 2023-12-20 – 2023-12-21 (×4): 100 mg via ORAL
  Filled 2023-12-20 (×2): qty 1

## 2023-12-20 MED ORDER — HYDROMORPHONE HCL 1 MG/ML IJ SOLN
INTRAMUSCULAR | Status: AC
  Filled 2023-12-20: qty 1

## 2023-12-20 MED ORDER — ONDANSETRON HCL 4 MG/2ML IJ SOLN
INTRAMUSCULAR | Status: AC
  Filled 2023-12-20: qty 2

## 2023-12-20 MED ORDER — TRIPLE ANTIBIOTIC 3.5-400-5000 EX OINT
1.0000 | TOPICAL_OINTMENT | Freq: Three times a day (TID) | CUTANEOUS | Status: DC | PRN
  Administered 2023-12-20 (×2): 1 via TOPICAL

## 2023-12-20 MED ORDER — DIPHENHYDRAMINE HCL 50 MG/ML IJ SOLN: 12.5000 mg | Freq: Four times a day (QID) | INTRAMUSCULAR | Status: DC | PRN

## 2023-12-20 MED ORDER — DIPHENHYDRAMINE HCL 12.5 MG/5ML PO ELIX: 12.5000 mg | ORAL_SOLUTION | Freq: Four times a day (QID) | ORAL | Status: DC | PRN

## 2023-12-20 MED ORDER — ONDANSETRON HCL 4 MG/2ML IJ SOLN
4.0000 mg | INTRAMUSCULAR | Status: DC | PRN
Start: 2023-12-20 — End: 2023-12-21
  Filled 2023-12-20: qty 2

## 2023-12-20 MED ORDER — CEFAZOLIN SODIUM-DEXTROSE 1-4 GM/50ML-% IV SOLN
1.0000 g | Freq: Three times a day (TID) | INTRAVENOUS | Status: AC
  Administered 2023-12-20 – 2023-12-21 (×4): 1 g via INTRAVENOUS
  Filled 2023-12-20 (×2): qty 50

## 2023-12-20 MED ORDER — ORAL CARE MOUTH RINSE: 15.0000 mL | OROMUCOSAL | Status: DC | PRN

## 2023-12-20 MED ORDER — PROPOFOL 10 MG/ML IV BOLUS
INTRAVENOUS | Status: AC
Start: 2023-12-20 — End: 2023-12-20
  Filled 2023-12-20: qty 20

## 2023-12-20 MED ORDER — CEFAZOLIN SODIUM-DEXTROSE 3-4 GM/150ML-% IV SOLN
3.0000 g | INTRAVENOUS | Status: AC
  Administered 2023-12-20 (×2): 3 g via INTRAVENOUS
  Filled 2023-12-20: qty 150

## 2023-12-20 MED ORDER — LACTATED RINGERS IV SOLN: INTRAVENOUS | Status: DC

## 2023-12-20 MED ORDER — FENTANYL CITRATE (PF) 100 MCG/2ML IJ SOLN
INTRAMUSCULAR | Status: DC | PRN
  Administered 2023-12-20: 100 ug via INTRAVENOUS
  Administered 2023-12-20: 50 ug via INTRAVENOUS
  Administered 2023-12-20: 25 ug via INTRAVENOUS
  Administered 2023-12-20: 100 ug via INTRAVENOUS
  Administered 2023-12-20: 25 ug via INTRAVENOUS
  Administered 2023-12-20: 50 ug via INTRAVENOUS
  Administered 2023-12-20 (×2): 25 ug via INTRAVENOUS

## 2023-12-20 MED ORDER — PROPOFOL 10 MG/ML IV BOLUS
INTRAVENOUS | Status: DC | PRN
  Administered 2023-12-20: 100 mg via INTRAVENOUS
  Administered 2023-12-20: 200 mg via INTRAVENOUS
  Administered 2023-12-20: 100 mg via INTRAVENOUS
  Administered 2023-12-20: 200 mg via INTRAVENOUS

## 2023-12-20 MED ORDER — SULFAMETHOXAZOLE-TRIMETHOPRIM 800-160 MG PO TABS: 1.0000 | ORAL_TABLET | Freq: Two times a day (BID) | ORAL | 0 refills | Status: AC

## 2023-12-20 MED ORDER — HEPARIN SODIUM (PORCINE) 1000 UNIT/ML IJ SOLN
INTRAMUSCULAR | Status: AC
  Filled 2023-12-20: qty 1

## 2023-12-20 MED ORDER — FENTANYL CITRATE (PF) 100 MCG/2ML IJ SOLN
INTRAMUSCULAR | Status: AC
  Filled 2023-12-20: qty 2

## 2023-12-20 MED ORDER — LIDOCAINE HCL (CARDIAC) PF 100 MG/5ML IV SOSY
PREFILLED_SYRINGE | INTRAVENOUS | Status: DC | PRN
  Administered 2023-12-20 (×2): 80 mg via INTRAVENOUS

## 2023-12-20 MED ORDER — CHLORHEXIDINE GLUCONATE 0.12 % MT SOLN
15.0000 mL | Freq: Once | OROMUCOSAL | Status: AC
  Administered 2023-12-20 (×2): 15 mL via OROMUCOSAL

## 2023-12-20 MED ORDER — ZOLPIDEM TARTRATE 5 MG PO TABS
5.0000 mg | ORAL_TABLET | Freq: Every evening | ORAL | Status: DC | PRN
  Filled 2023-12-20: qty 1

## 2023-12-20 MED ORDER — ALLOPURINOL 100 MG PO TABS
200.0000 mg | ORAL_TABLET | Freq: Every day | ORAL | Status: DC
  Administered 2023-12-21 (×2): 200 mg via ORAL
  Filled 2023-12-20: qty 2

## 2023-12-20 MED ORDER — IRBESARTAN 150 MG PO TABS
150.0000 mg | ORAL_TABLET | Freq: Every day | ORAL | Status: DC
  Administered 2023-12-21 (×2): 150 mg via ORAL
  Filled 2023-12-20: qty 1

## 2023-12-20 MED ORDER — DEXAMETHASONE SODIUM PHOSPHATE 10 MG/ML IJ SOLN
INTRAMUSCULAR | Status: DC | PRN
  Administered 2023-12-20 (×2): 4 mg via INTRAVENOUS

## 2023-12-20 MED ORDER — ORAL CARE MOUTH RINSE: 15.0000 mL | Freq: Once | OROMUCOSAL | Status: AC

## 2023-12-20 MED ORDER — INSULIN ASPART 100 UNIT/ML IJ SOLN: 0.0000 [IU] | INTRAMUSCULAR | Status: DC | PRN

## 2023-12-20 SURGICAL SUPPLY — 54 items
APPLICATOR COTTON TIP 6 STRL (MISCELLANEOUS) ×2 IMPLANT
BAG COUNTER SPONGE SURGICOUNT (BAG) IMPLANT
CATH FOLEY 2WAY SLVR 18FR 30CC (CATHETERS) ×2 IMPLANT
CATH ROBINSON RED A/P 16FR (CATHETERS) ×2 IMPLANT
CATH ROBINSON RED A/P 8FR (CATHETERS) ×2 IMPLANT
CATH TIEMANN FOLEY 18FR 5CC (CATHETERS) ×2 IMPLANT
CHLORAPREP W/TINT 26 (MISCELLANEOUS) ×2 IMPLANT
CLIP LIGATING HEM O LOK PURPLE (MISCELLANEOUS) ×2 IMPLANT
COVER SURGICAL LIGHT HANDLE (MISCELLANEOUS) ×2 IMPLANT
COVER TIP SHEARS 8 DVNC (MISCELLANEOUS) ×2 IMPLANT
CUTTER ECHEON FLEX ENDO 45 340 (ENDOMECHANICALS) ×2 IMPLANT
DERMABOND ADVANCED .7 DNX12 (GAUZE/BANDAGES/DRESSINGS) ×2 IMPLANT
DRAPE ARM DVNC X/XI (DISPOSABLE) ×8 IMPLANT
DRAPE COLUMN DVNC XI (DISPOSABLE) ×2 IMPLANT
DRAPE SURG IRRIG POUCH 19X23 (DRAPES) ×2 IMPLANT
DRIVER NDL LRG 8 DVNC XI (INSTRUMENTS) ×4 IMPLANT
DRIVER NDLE LRG 8 DVNC XI (INSTRUMENTS) ×4 IMPLANT
DRSG TEGADERM 4X4.75 (GAUZE/BANDAGES/DRESSINGS) ×2 IMPLANT
ELECT PENCIL ROCKER SW 15FT (MISCELLANEOUS) ×2 IMPLANT
ELECT REM PT RETURN 15FT ADLT (MISCELLANEOUS) ×2 IMPLANT
FORCEPS BPLR LNG DVNC XI (INSTRUMENTS) ×2 IMPLANT
FORCEPS PROGRASP DVNC XI (FORCEP) ×2 IMPLANT
GAUZE SPONGE 4X4 12PLY STRL (GAUZE/BANDAGES/DRESSINGS) ×2 IMPLANT
GLOVE BIO SURGEON STRL SZ 6.5 (GLOVE) ×2 IMPLANT
GLOVE SURG LX STRL 7.5 STRW (GLOVE) ×4 IMPLANT
GOWN STRL REUS W/ TWL XL LVL3 (GOWN DISPOSABLE) ×4 IMPLANT
GOWN STRL SURGICAL XL XLNG (GOWN DISPOSABLE) ×2 IMPLANT
HOLDER FOLEY CATH W/STRAP (MISCELLANEOUS) ×2 IMPLANT
IRRIGATION SUCT STRKRFLW 2 WTP (MISCELLANEOUS) ×2 IMPLANT
IV LACTATED RINGERS 1000ML (IV SOLUTION) ×2 IMPLANT
KIT TURNOVER KIT A (KITS) ×2 IMPLANT
NDL SAFETY ECLIPSE 18X1.5 (NEEDLE) IMPLANT
PACK ROBOT UROLOGY CUSTOM (CUSTOM PROCEDURE TRAY) ×2 IMPLANT
PLUG CATH AND CAP STRL 200 (CATHETERS) ×2 IMPLANT
RELOAD STAPLE 45 4.1 GRN THCK (STAPLE) ×2 IMPLANT
SCISSORS LAP 5X45 EPIX DISP (ENDOMECHANICALS) IMPLANT
SCISSORS MNPLR CVD DVNC XI (INSTRUMENTS) ×2 IMPLANT
SEAL UNIV 5-12 XI (MISCELLANEOUS) ×8 IMPLANT
SET TUBE SMOKE EVAC HIGH FLOW (TUBING) ×2 IMPLANT
SOL PREP POV-IOD 4OZ 10% (MISCELLANEOUS) ×2 IMPLANT
SOLUTION ELECTROSURG ANTI STCK (MISCELLANEOUS) ×2 IMPLANT
SPIKE FLUID TRANSFER (MISCELLANEOUS) ×2 IMPLANT
SUT ETHILON 3 0 PS 1 (SUTURE) ×2 IMPLANT
SUT MNCRL 3 0 RB1 (SUTURE) ×2 IMPLANT
SUT MNCRL 3 0 VIOLET RB1 (SUTURE) ×2 IMPLANT
SUT MNCRL AB 4-0 PS2 18 (SUTURE) ×4 IMPLANT
SUT PDS PLUS AB 0 CT-2 (SUTURE) ×4 IMPLANT
SUT VIC AB 0 CT1 27XBRD ANTBC (SUTURE) ×4 IMPLANT
SUT VIC AB 2-0 SH 27X BRD (SUTURE) ×2 IMPLANT
SUT VIC AB 3-0 SH 27X BRD (SUTURE) IMPLANT
SYR 27GX1/2 1ML LL SAFETY (SYRINGE) ×2 IMPLANT
TROCAR 12M 150ML BLUNT (TROCAR) IMPLANT
TROCAR Z THREAD OPTICAL 12X100 (TROCAR) IMPLANT
WATER STERILE IRR 1000ML POUR (IV SOLUTION) ×2 IMPLANT

## 2023-12-20 NOTE — Anesthesia Procedure Notes (Signed)
 Procedure Name: Intubation Date/Time: 12/20/2023 11:25 AM  Performed by: Brandy Almarie BROCKS, CRNAPre-anesthesia Checklist: Patient identified, Emergency Drugs available, Suction available and Patient being monitored Patient Re-evaluated:Patient Re-evaluated prior to induction Oxygen Delivery Method: Circle system utilized Preoxygenation: Pre-oxygenation with 100% oxygen Induction Type: IV induction Ventilation: Oral airway inserted - appropriate to patient size and Two handed mask ventilation required Laryngoscope Size: Mac and 4 Grade View: Grade II Tube type: Oral Tube size: 7.5 mm Number of attempts: 1 Airway Equipment and Method: Stylet Placement Confirmation: ETT inserted through vocal cords under direct vision, positive ETCO2 and breath sounds checked- equal and bilateral Secured at: 24 cm Tube secured with: Tape Dental Injury: Teeth and Oropharynx as per pre-operative assessment

## 2023-12-20 NOTE — Discharge Instructions (Addendum)
 Activity:  You are encouraged to ambulate frequently (about every hour during waking hours) to help prevent blood clots from forming in your legs or lungs.  However, you should not engage in any heavy lifting (> 10-15 lbs), strenuous activity, or straining. Diet: You should continue a clear liquid diet until passing gas from below.  Once this occurs, you may advance your diet to a soft diet that would be easy to digest (i.e soups, scrambled eggs, mashed potatoes, etc.) for 24 hours just as you would if getting over a bad stomach flu.  If tolerating this diet well for 24 hours, you may then begin eating regular food.  It will be normal to have some amount of bloating, nausea, and abdominal discomfort intermittently. Prescriptions:  You will be provided a prescription for pain medication to take as needed.  If your pain is not severe enough to require the prescription pain medication, you may take Tylenol  instead.  You should also take an over the counter stool softener (Colace 100 mg twice daily) to avoid straining with bowel movements as the pain medication may constipate you. Finally, you will also be provided a prescription for an antibiotic to begin the day prior to your return visit in the office for catheter removal. Catheter care: You will be taught how to take care of the catheter by the nursing staff prior to discharge from the hospital.  You may use both a leg bag and the larger bedside bag but it is recommended to at least use the bigger bedside bag at nighttime as the leg bag is small and will fill up overnight and also does not drain as well when lying flat. You may periodically feel a strong urge to void with the catheter in place.  This is a bladder spasm and most often can occur when having a bowel movement or when you are moving around. It is typically self-limited and usually will stop after a few minutes.  You may use some Vaseline or Neosporin around the tip of the catheter to reduce friction  at the tip of the penis. Incisions: You may remove your dressing bandages the 2nd day after surgery.  You most likely will have a few small staples in each of the incisions and once the bandages are removed, the incisions may stay open to air.  You may start showering (not soaking or bathing in water ) 48 hours after surgery and the incisions simply need to be patted dry after the shower.  No additional care is needed. What to call us  about: You should call the office [phone]) if you develop fever > 101, persistent vomiting, or the catheter stops draining. Also, feel free to call with any other questions you may have and remember the handout that was provided to you as a reference preoperatively which answers many of the common questions that arise after surgery. You may resume Mounjaro, aspirin , advil , aleve, vitamins, and supplements 7 days after surgery.

## 2023-12-20 NOTE — Anesthesia Postprocedure Evaluation (Signed)
 Anesthesia Post Note  Patient: Psychologist, prison and probation services  Procedure(s) Performed: PROSTATECTOMY, RADICAL, ROBOT-ASSISTED, LAPAROSCOPIC (Prostate) LYMPHADENECTOMY, PELVIS, ROBOT-ASSISTED (Bilateral: Pelvis)     Patient location during evaluation: PACU Anesthesia Type: General Level of consciousness: awake and alert Pain management: pain level controlled Vital Signs Assessment: post-procedure vital signs reviewed and stable Respiratory status: spontaneous breathing, nonlabored ventilation and respiratory function stable Cardiovascular status: blood pressure returned to baseline and stable Postop Assessment: no apparent nausea or vomiting Anesthetic complications: no   No notable events documented.  Last Vitals:  Vitals:   12/20/23 1515 12/20/23 1530  BP: (!) 150/82 (!) 151/97  Pulse: 71 66  Resp: (!) 7 17  Temp:    SpO2: 100% 100%    Last Pain:  Vitals:   12/20/23 1505  TempSrc:   PainSc: 5                  Brandon Henson,W. EDMOND

## 2023-12-20 NOTE — Interval H&P Note (Signed)
 History and Physical Interval Note:  12/20/2023 10:35 AM  Brandon Henson  has presented today for surgery, with the diagnosis of PROSTATE CANCER.  The various methods of treatment have been discussed with the patient and family. After consideration of risks, benefits and other options for treatment, the patient has consented to  Procedure(s) with comments: PROSTATECTOMY, RADICAL, ROBOT-ASSISTED, LAPAROSCOPIC (N/A) - LEVEL 3 LYMPHADENECTOMY, PELVIS, ROBOT-ASSISTED (Bilateral) as a surgical intervention.  The patient's history has been reviewed, patient examined, no change in status, stable for surgery.  I have reviewed the patient's chart and labs.  Questions were answered to the patient's satisfaction.     [employer]

## 2023-12-20 NOTE — Op Note (Signed)
 Preoperative diagnosis: Clinically localized adenocarcinoma of the prostate (clinical stage T1c N0 M0)  Postoperative diagnosis: Clinically localized adenocarcinoma of the prostate (clinical stage T1c N0 M0)  Procedure:  Robotic assisted laparoscopic radical prostatectomy (right nerve sparing) Bilateral robotic assisted laparoscopic pelvic lymphadenectomy  Surgeon: Gretel CANDIE Renda Mickey. M.D.  Assistant(s): Alan Hammonds, PA-C  An assistant was required for this surgical procedure.  The duties of the assistant included but were not limited to suctioning, passing suture, camera manipulation, retraction. This procedure would not be able to be performed without an Geophysicist/field seismologist.   Resident: Dr. Jacqulyn Bound  Anesthesia: General  Complications: None  EBL: 100 mL  IVF:  1200 mL crystalloid  Specimens: Prostate and seminal vesicles Right pelvic lymph nodes Left pelvic lymph nodes  Disposition of specimens: Pathology  Drains: 20 Fr coude catheter # 19 Blake pelvic drain  Indication: Brandon Henson is a 58 y.o. patient with clinically localized prostate cancer.  After a thorough review of the management options for treatment of prostate cancer, he elected to proceed with surgical therapy and the above procedure(s).  We have discussed the potential benefits and risks of the procedure, side effects of the proposed treatment, the likelihood of the patient achieving the goals of the procedure, and any potential problems that might occur during the procedure or recuperation. Informed consent has been obtained.  Description of procedure:  The patient was taken to the operating room and a general anesthetic was administered. He was given preoperative antibiotics, placed in the dorsal lithotomy position, and prepped and draped in the usual sterile fashion. Next a preoperative timeout was performed. A urethral catheter was placed into the bladder and a site was selected near the umbilicus for  placement of the camera port. This was placed using a standard open Hassan technique which allowed entry into the peritoneal cavity under direct vision and without difficulty. An 8 mm port was placed and a pneumoperitoneum established. The camera was then used to inspect the abdomen and there was no evidence of any intra-abdominal injuries or other abnormalities. The remaining abdominal ports were then placed. 8 mm robotic ports were placed in the right lower quadrant, left lower quadrant, and far left lateral abdominal wall. A 5 mm port was placed in the right upper quadrant and a 12 mm port was placed in the right lateral abdominal wall for laparoscopic assistance. All ports were placed under direct vision without difficulty. The surgical cart was then docked.   Utilizing the cautery scissors, the bladder was reflected posteriorly allowing entry into the space of Retzius and identification of the endopelvic fascia and prostate. The periprostatic fat was then removed from the prostate allowing full exposure of the endopelvic fascia. The endopelvic fascia was then incised from the apex back to the base of the prostate bilaterally and the underlying levator muscle fibers were swept laterally off the prostate thereby isolating the dorsal venous complex. The dorsal vein was then stapled and divided with a 45 mm Flex Echelon stapler. Attention then turned to the bladder neck which was divided anteriorly thereby allowing entry into the bladder and exposure of the urethral catheter. The catheter balloon was deflated and the catheter was brought into the operative field and used to retract the prostate anteriorly. The posterior bladder neck was then examined and was divided allowing further dissection between the bladder and prostate posteriorly until the vasa deferentia and seminal vessels were identified. The vasa deferentia were isolated, divided, and lifted anteriorly. The seminal vesicles were dissected  down to  their tips with care to control the seminal vascular arterial blood supply. These structures were then lifted anteriorly and the space between Denonvillier's fascia and the anterior rectum was developed with a combination of sharp and blunt dissection. This isolated the vascular pedicles of the prostate.  The lateral prostatic fascia on the right side of the prostate was then sharply incised allowing release of the neurovascular bundle. The vascular pedicle of the prostate on the right side was then ligated with Weck clips between the prostate and neurovascular bundle and divided with sharp cold scissor dissection resulting in neurovascular bundle preservation. On the left side, a wide non nerve sparing dissection was performed with Weck clips used to ligate the vascular pedicle of the prostate. The neurovascular bundle on the right side was then separated off the apex of the prostate and urethra.  The urethra was then sharply transected allowing the prostate specimen to be disarticulated. The pelvis was copiously irrigated and hemostasis was ensured. There was no evidence for rectal injury.  Attention then turned to the right pelvic sidewall. The fibrofatty tissue between the external iliac vein, confluence of the iliac vessels, hypogastric artery, and Cooper's ligament was dissected free from the pelvic sidewall with care to preserve the obturator nerve. Weck clips were used for lymphostasis and hemostasis. An identical procedure was performed on the contralateral side and the lymphatic packets were removed for permanent pathologic analysis.  Attention then turned to the urethral anastomosis. A 2-0 Vicryl slip knot was placed between Denonvillier's fascia, the posterior bladder neck, and the posterior urethra to reapproximate these structures. A double-armed 3-0 Monocryl suture was then used to perform a 360 running tension-free anastomosis between the bladder neck and urethra. A new urethral catheter was  then placed into the bladder and irrigated. There were no blood clots within the bladder and the anastomosis appeared to be watertight. A #19 Blake drain was then brought through the left lateral 8 mm port site and positioned appropriately within the pelvis. It was secured to the skin with a nylon suture. The surgical cart was then undocked. The right lateral 12 mm port site was closed at the fascial level with a 0 Vicryl suture placed laparoscopically. All remaining ports were then removed under direct vision. The prostate specimen was removed intact within the Endopouch retrieval bag via the periumbilical camera port site. This fascial opening was closed with two running 0 Vicryl sutures. 0.25% Marcaine  was then injected into all port sites and all incisions were reapproximated at the skin level with 4-0 Monocryl subcuticular sutures. Dermabond was applied. The patient appeared to tolerate the procedure well and without complications. The patient was able to be extubated and transferred to the recovery unit in satisfactory condition.   Gretel CANDIE Renda Teddie MD

## 2023-12-20 NOTE — Transfer of Care (Signed)
 Immediate Anesthesia Transfer of Care Note  Patient: Brandon Henson  Procedure(s) Performed: PROSTATECTOMY, RADICAL, ROBOT-ASSISTED, LAPAROSCOPIC (Prostate) LYMPHADENECTOMY, PELVIS, ROBOT-ASSISTED (Bilateral: Pelvis)  Patient Location: PACU  Anesthesia Type:General  Level of Consciousness: awake, alert , and oriented  Airway & Oxygen Therapy: Patient Spontanous Breathing and Patient connected to face mask oxygen  Post-op Assessment: Report given to RN, Post -op Vital signs reviewed and stable, and Patient moving all extremities X 4  Post vital signs: Reviewed and stable  Last Vitals:  Vitals Value Taken Time  BP 150/93 12/20/23 14:39  Temp    Pulse 67 12/20/23 14:40  Resp 35 12/20/23 14:42  SpO2 98 % 12/20/23 14:40  Vitals shown include unfiled device data.  Last Pain:  Vitals:   12/20/23 0918  TempSrc:   PainSc: 0-No pain         Complications: No notable events documented.

## 2023-12-21 ENCOUNTER — Encounter (HOSPITAL_COMMUNITY): Payer: Self-pay | Admitting: Urology

## 2023-12-21 DIAGNOSIS — C61 Malignant neoplasm of prostate: Secondary | ICD-10-CM | POA: Diagnosis not present

## 2023-12-21 LAB — HEMOGLOBIN AND HEMATOCRIT, BLOOD
HCT: 41.6 % (ref 39.0–52.0)
Hemoglobin: 12.7 g/dL — ABNORMAL LOW (ref 13.0–17.0)

## 2023-12-21 MED ORDER — OXYCODONE HCL 5 MG PO TABS: 5.0000 mg | ORAL_TABLET | ORAL | Status: DC | PRN

## 2023-12-21 MED ORDER — SODIUM CHLORIDE 0.9 % IV BOLUS
500.0000 mL | Freq: Once | INTRAVENOUS | Status: AC
  Administered 2023-12-21 (×2): 500 mL via INTRAVENOUS

## 2023-12-21 MED ORDER — BISACODYL 10 MG RE SUPP
10.0000 mg | Freq: Once | RECTAL | Status: AC
  Administered 2023-12-21 (×2): 10 mg via RECTAL
  Filled 2023-12-21: qty 1

## 2023-12-21 NOTE — Progress Notes (Signed)
 1 Day Post-Op Subjective: The patient is doing well.  No nausea or vomiting. Pain is adequately controlled.  Objective: Vital signs in last 24 hours: Temp:  [97.5 F (36.4 C)-98.9 F (37.2 C)] 98.2 F (36.8 C) (08/12 0501) Pulse Rate:  [65-93] 89 (08/12 0501) Resp:  [7-34] 18 (08/12 0501) BP: (124-175)/(76-116) 124/76 (08/12 0501) SpO2:  [97 %-100 %] 97 % (08/12 0501) Weight:  [159.3 kg] 159.3 kg (08/11 0918)  Intake/Output from previous day: 08/11 0701 - 08/12 0700 In: 1658.8 [I.V.:1458.8; IV Piggyback:200] Out: 1035 [Urine:550; Drains:385; Blood:100] Intake/Output this shift: Total I/O In: 0.8 [I.V.:0.8] Out: 360 [Urine:150; Drains:210]  Physical Exam:  General: Alert and oriented. CV: RRR Lungs: Clear bilaterally. GI: Soft, Nondistended. Incisions: Clean, dry, and intact Urine: Clear Extremities: Nontender, no erythema, no edema.  Lab Results: Recent Labs    12/20/23 2058 12/21/23 0429  HGB 13.5 12.7*  HCT 45.6 41.6      Assessment/Plan: POD# 1 s/p robotic prostatectomy.  1) Bolus, keep drain for now 2) Ambulate, Incentive spirometry 3) Transition to oral pain medication 4) Dulcolax suppository 5) Plan for likely discharge later today if patient progresses well    LOS: 0 days   Brandon Henson 12/21/2023, 6:54 AM

## 2023-12-21 NOTE — Discharge Summary (Signed)
 Date of admission: 12/20/2023  Date of discharge: 12/21/2023  Admission diagnosis: Prostate Cancer  Discharge diagnosis: Prostate Cancer  History and Physical: For full details, please see admission history and physical. Briefly, Brandon Henson is a 58 y.o. gentleman with localized prostate cancer.  After discussing management/treatment options, he elected to proceed with surgical treatment.  Hospital Course: Brandon Henson was taken to the operating room on 12/20/2023 and underwent a robotic assisted laparoscopic radical prostatectomy. He tolerated this procedure well and without complications. Postoperatively, he was able to be transferred to a regular hospital room following recovery from anesthesia.  He was able to begin ambulating the night of surgery. He remained hemodynamically stable overnight.  He had excellent urine output with appropriately minimal output from his pelvic drain and his pelvic drain was removed on POD #1.  He was transitioned to oral pain medication, tolerated a clear liquid diet, and had met all discharge criteria and was able to be discharged home later on POD#1.  Laboratory values:  Recent Labs    12/20/23 2058 12/21/23 0429  HGB 13.5 12.7*  HCT 45.6 41.6    Disposition: Home  Discharge instruction: He was instructed to be ambulatory but to refrain from heavy lifting, strenuous activity, or driving. He was instructed on urethral catheter care.  Discharge medications:   Allergies as of 12/21/2023   No Known Allergies      Medication List     STOP taking these medications    aspirin  81 MG chewable tablet   b complex vitamins capsule   cholecalciferol 25 MCG (1000 UNIT) tablet Commonly known as: VITAMIN D3   Mounjaro 7.5 MG/0.5ML Pen Generic drug: tirzepatide       TAKE these medications    allopurinol  100 MG tablet Commonly known as: ZYLOPRIM  Take 200 mg by mouth daily.   amLODipine  10 MG tablet Commonly known as: NORVASC  Take 10 mg by  mouth daily.   docusate sodium  100 MG capsule Commonly known as: COLACE Take 1 capsule (100 mg total) by mouth 2 (two) times daily.   omeprazole  20 MG capsule Commonly known as: PRILOSEC Take 1 capsule (20 mg total) by mouth daily. What changed:  when to take this reasons to take this   sildenafil 50 MG tablet Commonly known as: VIAGRA Take 50 mg by mouth as needed for erectile dysfunction.   sulfamethoxazole -trimethoprim  800-160 MG tablet Commonly known as: BACTRIM  DS Take 1 tablet by mouth 2 (two) times daily. Start the day prior to foley removal appointment   telmisartan 40 MG tablet Commonly known as: MICARDIS Take 40 mg by mouth daily.   traMADol  50 MG tablet Commonly known as: Ultram  Take 1-2 tablets (50-100 mg total) by mouth every 6 (six) hours as needed for moderate pain (pain score 4-6) or severe pain (pain score 7-10).        Followup: He will followup in 1 week for catheter removal and to discuss his surgical pathology results.

## 2023-12-21 NOTE — Plan of Care (Signed)
   Problem: Education: Goal: Knowledge of General Education information will improve Description: Including pain rating scale, medication(s)/side effects and non-pharmacologic comfort measures Outcome: Progressing   Problem: Pain Managment: Goal: General experience of comfort will improve and/or be controlled Outcome: Progressing   Problem: Safety: Goal: Ability to remain free from injury will improve Outcome: Progressing

## 2023-12-21 NOTE — Progress Notes (Signed)
   12/21/23 0958  TOC Brief Assessment  Insurance and Status Reviewed  Patient has primary care physician Yes  Home environment has been reviewed home w/ spouse  Prior level of function: independent  Prior/Current Home Services No current home services  Social Drivers of Health Review SDOH reviewed no interventions necessary  Readmission risk has been reviewed Yes  Transition of care needs no transition of care needs at this time

## 2023-12-21 NOTE — Progress Notes (Signed)
 Mobility Specialist - Progress Note   12/21/23 1143  Mobility  Activity Ambulated with assistance  Level of Assistance Modified independent, requires aide device or extra time  Assistive Device Front wheel walker  Distance Ambulated (ft) 1000 ft  Range of Motion/Exercises Active  Activity Response Tolerated well  Mobility Referral Yes  Mobility visit 1 Mobility  Mobility Specialist Start Time (ACUTE ONLY) 1130  Mobility Specialist Stop Time (ACUTE ONLY) 1143  Mobility Specialist Time Calculation (min) (ACUTE ONLY) 13 min   Pt found at doorway requesting to ambulate. No complaints and returned to bed with all needs met. Call bell in reach.  Erminio Leos,  Mobility Specialist Can be reached via Secure Chat

## 2023-12-25 ENCOUNTER — Emergency Department (HOSPITAL_COMMUNITY)

## 2023-12-25 ENCOUNTER — Encounter (HOSPITAL_COMMUNITY): Payer: Self-pay | Admitting: *Deleted

## 2023-12-25 ENCOUNTER — Emergency Department (HOSPITAL_COMMUNITY)
Admission: EM | Admit: 2023-12-25 | Discharge: 2023-12-25 | Disposition: A | Discharge status: Home / Self Care | Attending: Emergency Medicine | Admitting: Emergency Medicine

## 2023-12-25 ENCOUNTER — Other Ambulatory Visit: Payer: Self-pay

## 2023-12-25 DIAGNOSIS — E119 Type 2 diabetes mellitus without complications: Secondary | ICD-10-CM | POA: Diagnosis not present

## 2023-12-25 DIAGNOSIS — I2510 Atherosclerotic heart disease of native coronary artery without angina pectoris: Secondary | ICD-10-CM | POA: Insufficient documentation

## 2023-12-25 DIAGNOSIS — N5089 Other specified disorders of the male genital organs: Secondary | ICD-10-CM

## 2023-12-25 DIAGNOSIS — I10 Essential (primary) hypertension: Secondary | ICD-10-CM | POA: Insufficient documentation

## 2023-12-25 DIAGNOSIS — Z79899 Other long term (current) drug therapy: Secondary | ICD-10-CM | POA: Insufficient documentation

## 2023-12-25 DIAGNOSIS — N492 Inflammatory disorders of scrotum: Secondary | ICD-10-CM | POA: Insufficient documentation

## 2023-12-25 DIAGNOSIS — L03818 Cellulitis of other sites: Secondary | ICD-10-CM

## 2023-12-25 DIAGNOSIS — Z8546 Personal history of malignant neoplasm of prostate: Secondary | ICD-10-CM | POA: Diagnosis not present

## 2023-12-25 LAB — CBC WITH DIFFERENTIAL/PLATELET
Basophils Absolute: 0.1 K/uL (ref 0.0–0.1)
Basophils Relative: 1 %
Eosinophils Absolute: 0.1 K/uL (ref 0.0–0.5)
Eosinophils Relative: 1 %
HCT: 41.9 % (ref 39.0–52.0)
Hemoglobin: 12.7 g/dL — ABNORMAL LOW (ref 13.0–17.0)
Lymphocytes Relative: 29 %
Lymphs Abs: 1.5 K/uL (ref 0.7–4.0)
MCH: 21.2 pg — ABNORMAL LOW (ref 26.0–34.0)
MCHC: 30.3 g/dL (ref 30.0–36.0)
MCV: 69.8 fL — ABNORMAL LOW (ref 80.0–100.0)
Monocytes Absolute: 0.6 K/uL (ref 0.1–1.0)
Monocytes Relative: 12 %
Neutro Abs: 3 K/uL (ref 1.7–7.7)
Neutrophils Relative %: 57 %
Platelets: 256 K/uL (ref 150–400)
RBC: 6 MIL/uL — ABNORMAL HIGH (ref 4.22–5.81)
RDW: 15.7 % — ABNORMAL HIGH (ref 11.5–15.5)
WBC: 5.3 K/uL (ref 4.0–10.5)
nRBC: 0 % (ref 0.0–0.2)

## 2023-12-25 LAB — URINALYSIS, ROUTINE W REFLEX MICROSCOPIC
Bilirubin Urine: NEGATIVE
Glucose, UA: NEGATIVE mg/dL
Ketones, ur: NEGATIVE mg/dL
Nitrite: NEGATIVE
Protein, ur: 100 mg/dL — AB
RBC / HPF: >50 RBC/hpf (ref 0–5)
Specific Gravity, Urine: 1.01 (ref 1.005–1.030)
pH: 5 (ref 5.0–8.0)

## 2023-12-25 LAB — COMPREHENSIVE METABOLIC PANEL WITH GFR
ALT: 36 U/L (ref 0–44)
AST: 26 U/L (ref 15–41)
Albumin: 3.4 g/dL — ABNORMAL LOW (ref 3.5–5.0)
Alkaline Phosphatase: 48 U/L (ref 38–126)
Anion gap: 8 (ref 5–15)
BUN: 10 mg/dL (ref 6–20)
CO2: 26 mmol/L (ref 22–32)
Calcium: 9.4 mg/dL (ref 8.9–10.3)
Chloride: 105 mmol/L (ref 98–111)
Creatinine, Ser: 1.1 mg/dL (ref 0.61–1.24)
GFR, Estimated: >60 mL/min (ref >60)
Glucose, Bld: 114 mg/dL — ABNORMAL HIGH (ref 70–99)
Potassium: 4.4 mmol/L (ref 3.5–5.1)
Sodium: 139 mmol/L (ref 135–145)
Total Bilirubin: 0.7 mg/dL (ref 0.0–1.2)
Total Protein: 7.2 g/dL (ref 6.5–8.1)

## 2023-12-25 MED ORDER — SULFAMETHOXAZOLE-TRIMETHOPRIM 800-160 MG PO TABS
1.0000 | ORAL_TABLET | Freq: Once | ORAL | Status: AC
  Administered 2023-12-25: 1 via ORAL
  Filled 2023-12-25: qty 1

## 2023-12-25 MED ORDER — IOHEXOL 350 MG/ML SOLN
75.0000 mL | Freq: Once | INTRAVENOUS | Status: AC | PRN
  Administered 2023-12-25: 75 mL via INTRAVENOUS

## 2023-12-25 MED ORDER — SULFAMETHOXAZOLE-TRIMETHOPRIM 800-160 MG PO TABS: 1.0000 | ORAL_TABLET | Freq: Two times a day (BID) | ORAL | 0 refills | Status: AC

## 2023-12-25 NOTE — ED Provider Triage Note (Signed)
 Emergency Medicine Provider Triage Evaluation Note  Cuinn Westerhold , a 58 y.o. male  was evaluated in triage.  Pt complains of penile swelling.  Patient reportedly had prostatectomy after being diagnosed with prostate cancer and has an indwelling catheter in place.  Reports that he is supposed to have his cath removed in the next Avril days when he follows up with urology.  States that he has had increased swelling in the penis and scrotum since the surgery.  Endorses some slight urinary discomfort.  No reported fever, chills or bodyaches.  Denies any significant pain or abdominal tenderness at the incision sites where surgery was performed.  No reported drainage or discharge.  States that urine has remained clear with no obvious blood or clouding.  Review of Systems  Positive: As above Negative: As above  Physical Exam  BP (!) 144/89 (BP Location: Right Arm)   Pulse 85   Temp 98 F (36.7 C)   Resp 16   SpO2 97%  Gen:   Awake, no distress   Resp:  Normal effort  MSK:   Moves extremities without difficulty  Other:    Medical Decision Making  Medically screening exam initiated at 2:38 PM.  Appropriate orders placed.  Caydin Yeatts was informed that the remainder of the evaluation will be completed by another provider, this initial triage assessment does not replace that evaluation, and the importance of remaining in the ED until their evaluation is complete.     Vint Pola A, PA-C 12/25/23 1440

## 2023-12-25 NOTE — ED Triage Notes (Signed)
 Patient  presents to ed c/o pain and swelling around penis last pm , states he had his prostate removed Monday.

## 2023-12-25 NOTE — ED Provider Notes (Signed)
 [place] [facility] AT [facility] Provider Note   CSN: [id number] Arrival date & time: 12/25/23  1350     Patient presents with: Groin Swelling   Brandon Henson is a 58 y.o. male.   Pt is a 58 yo male with pmhx significant for HTN, CAD, gout, DM2, GERD, arthritis, anxiety, and prostate cancer.  Pt had a prostatectomy on 8/11 by Dr. Renda.  He had a foley placed and things were going well.  He did notice some swelling to his groin last night and it was worse this am, so he came in.  No fevers.  Per d/c papers, pt was supposed to start Bactrim , but wife said they did not have it the pharmacy.       Prior to Admission medications   Medication Sig Start Date End Date Taking? Authorizing Provider  sulfamethoxazole -trimethoprim  (BACTRIM  DS) 800-160 MG tablet Take 1 tablet by mouth 2 (two) times daily for 7 days. 12/25/23 01/01/24 Yes Dean Clarity, MD  allopurinol  (ZYLOPRIM ) 100 MG tablet Take 200 mg by mouth daily. 12/03/22   [provider]  amLODipine  (NORVASC ) 10 MG tablet Take 10 mg by mouth daily. 04/29/20   [provider]  docusate sodium  (COLACE) 100 MG capsule Take 1 capsule (100 mg total) by mouth 2 (two) times daily. 12/20/23   Cory Palma, PA-C  omeprazole  (PRILOSEC) 20 MG capsule Take 1 capsule (20 mg total) by mouth daily. Patient taking differently: Take 20 mg by mouth daily as needed (acid reflux). 07/31/20   Armenta Canning, MD  sildenafil (VIAGRA) 50 MG tablet Take 50 mg by mouth as needed for erectile dysfunction. 07/18/20   [provider]  sulfamethoxazole -trimethoprim  (BACTRIM  DS) 800-160 MG tablet Take 1 tablet by mouth 2 (two) times daily. Start the day prior to foley removal appointment 12/20/23   Cory Palma, PA-C  telmisartan (MICARDIS) 40 MG tablet Take 40 mg by mouth daily.    [provider]  traMADol  (ULTRAM ) 50 MG tablet Take 1-2 tablets (50-100 mg total) by mouth every 6 (six) hours as needed for  moderate pain (pain score 4-6) or severe pain (pain score 7-10). 12/20/23   Cory Palma, PA-C    Allergies: Patient has no known allergies.    Review of Systems  Genitourinary:  Positive for testicular pain.  All other systems reviewed and are negative.   Updated Vital Signs BP 133/80   Pulse 83   Temp 98.2 F (36.8 C)   Resp 17   Ht 6' 1 (1.854 m)   Wt (!) 164.2 kg   SpO2 100%   BMI 47.76 kg/m   Physical Exam Vitals and nursing note reviewed.  Constitutional:      Appearance: Normal appearance. He is obese.  HENT:     Head: Normocephalic and atraumatic.     Right Ear: External ear normal.     Left Ear: External ear normal.     Nose: Nose normal.     Mouth/Throat:     Mouth: Mucous membranes are moist.     Pharynx: Oropharynx is clear.  Eyes:     Extraocular Movements: Extraocular movements intact.     Conjunctiva/sclera: Conjunctivae normal.     Pupils: Pupils are equal, round, and reactive to light.  Cardiovascular:     Rate and Rhythm: Normal rate and regular rhythm.     Pulses: Normal pulses.     Heart sounds: Normal heart sounds.  Pulmonary:     Effort: Pulmonary effort is  normal.     Breath sounds: Normal breath sounds.  Abdominal:     General: Abdomen is flat. Bowel sounds are normal.     Palpations: Abdomen is soft.  Genitourinary:    Comments: Penis nl.  Foley in place.  Some swelling in the groin.  Mild tenderness. Musculoskeletal:        General: Normal range of motion.     Cervical back: Normal range of motion.  Skin:    General: Skin is warm.     Capillary Refill: Capillary refill takes less than 2 seconds.  Neurological:     General: No focal deficit present.     Mental Status: He is alert and oriented to person, place, and time.  Psychiatric:        Mood and Affect: Mood normal.        Behavior: Behavior normal.        Thought Content: Thought content normal.     (all labs ordered are listed, but only abnormal results are  displayed) Labs Reviewed  CBC WITH DIFFERENTIAL/PLATELET - Abnormal; Notable for the following components:      Result Value   RBC 6.00 (*)    Hemoglobin 12.7 (*)    MCV 69.8 (*)    MCH 21.2 (*)    RDW 15.7 (*)    All other components within normal limits  COMPREHENSIVE METABOLIC PANEL WITH GFR - Abnormal; Notable for the following components:   Glucose, Bld 114 (*)    Albumin 3.4 (*)    All other components within normal limits  URINALYSIS, ROUTINE W REFLEX MICROSCOPIC - Abnormal; Notable for the following components:   APPearance HAZY (*)    Hgb urine dipstick LARGE (*)    Protein, ur 100 (*)    Leukocytes,Ua TRACE (*)    Bacteria, UA RARE (*)    All other components within normal limits    EKG: None  Radiology: CT PELVIS W CONTRAST Result Date: 12/25/2023 CLINICAL DATA:  Hernia suspected, inguinal or femoral, postop groin swelling EXAM: CT PELVIS WITH CONTRAST TECHNIQUE: Multidetector CT imaging of the pelvis was performed using the standard protocol following the bolus administration of intravenous contrast. RADIATION DOSE REDUCTION: This exam was performed according to the departmental dose-optimization program which includes automated exposure control, adjustment of the mA and/or kV according to patient size and/or use of iterative reconstruction technique. CONTRAST:  75mL OMNIPAQUE  IOHEXOL  350 MG/ML SOLN COMPARISON:  PET/CT 10/11/2023 FINDINGS: Urinary Tract: Foley catheter in the nondistended decompressed bladder. Bowel:  Unremarkable visualized pelvic bowel loops. Vascular/Lymphatic: Arterial atherosclerotic calcification including the aorta. Inguinal lymphadenopathy bilaterally. On the left the lymph nodes measure up to 1.7 cm (series 3, image 134) and on the right they measure up to 1.6 cm (3/18). Reproductive: Postoperative change of recent prostatectomy with free fluid and stranding in the low pelvis. Other: Nonspecific stranding in the subcutaneous fat of the right flank. No  subcutaneous or intrapelvic abscess. No inguinal or femoral hernia. Musculoskeletal: No acute fracture IMPRESSION: 1. Postoperative change of recent prostatectomy with free fluid and stranding in the low pelvis. No drainable fluid collection. 2. Bilateral inguinal lymphadenopathy, increased compared to 10/11/2023 PET/CT. Close interval follow-up is recommended. 3. Nonspecific stranding in the subcutaneous fat of the low pelvis and right flank. Correlate for cellulitis. 4. Aortic Atherosclerosis (ICD10-I70.0). Electronically Signed   By: Norman Gatlin M.D.   On: 12/25/2023 20:04     Procedures   Medications Ordered in the ED  sulfamethoxazole -trimethoprim  (BACTRIM  DS) 800-160  MG per tablet 1 tablet (has no administration in time range)  iohexol  (OMNIPAQUE ) 350 MG/ML injection 75 mL (75 mLs Intravenous Contrast Given 12/25/23 1933)                                    Medical Decision Making Amount and/or Complexity of Data Reviewed Radiology: ordered.  Risk Prescription drug management.   This patient presents to the ED for concern of groin pain, this involves an extensive number of treatment options, and is a complaint that carries with it a high risk of complications and morbidity.  The differential diagnosis includes infection, post op bleeding   Co morbidities that complicate the patient evaluation   HTN, CAD, gout, DM2, GERD, arthritis, anxiety, and prostate cancer   Additional history obtained:  Additional history obtained from epic chart review  Lab Tests:  I Ordered, and personally interpreted labs.  The pertinent results include:  cbc with hgb 12.7 (stable);  cmp nl; UA >50 RBCs   Imaging Studies ordered:  I ordered imaging studies including ct pelvis  I independently visualized and interpreted imaging which showed  1. Postoperative change of recent prostatectomy with free fluid and  stranding in the low pelvis. No drainable fluid collection.  2. Bilateral inguinal  lymphadenopathy, increased compared to  10/11/2023 PET/CT. Close interval follow-up is recommended.  3. Nonspecific stranding in the subcutaneous fat of the low pelvis  and right flank. Correlate for cellulitis.  4. Aortic Atherosclerosis (ICD10-I70.0).   I agree with the radiologist interpretation   Medicines ordered and prescription drug management:  I ordered medication including bactrim   for infection  Reevaluation of the patient after these medicines showed that the patient improved I have reviewed the patients home medicines and have made adjustments as needed   Test Considered:  ct   Critical Interventions:  abx   Consultations Obtained:  I requested consultation with the urologist (Dr. Devere),  and discussed lab and imaging findings as well as pertinent plan - he recommended CT.  I did review the results with him and he recommends Bactrim .   Problem List / ED Course:  Scrotal swelling:  mild cellulitis.  No fournier's gangrene.  Pt d/c with bactrim .  Return if worse.  F/u with urology as scheduled.   Reevaluation:  After the interventions noted above, I reevaluated the patient and found that they have :improved   Social Determinants of Health:  Lives at home   Dispostion:  After consideration of the diagnostic results and the patients response to treatment, I feel that the patent would benefit from discharge with outpatient f/u.       Final diagnoses:  Cellulitis of other specified site  Scrotal swelling    ED Discharge Orders          Ordered    sulfamethoxazole -trimethoprim  (BACTRIM  DS) 800-160 MG tablet  2 times daily        12/25/23 2025               Dean Clarity, MD 12/25/23 2026

## 2023-12-27 LAB — SURGICAL PATHOLOGY
